# Patient Record
Sex: Male | Born: 1960 | Race: Black or African American | Hispanic: No | Marital: Married | Smoking: Never smoker
Health system: Southern US, Community
[De-identification: ages and names within clinical notes are randomized; demographics above are authoritative.]

## PROBLEM LIST (undated history)

## (undated) DIAGNOSIS — E7800 Pure hypercholesterolemia, unspecified: Secondary | ICD-10-CM

## (undated) DIAGNOSIS — E119 Type 2 diabetes mellitus without complications: Secondary | ICD-10-CM

## (undated) DIAGNOSIS — A048 Other specified bacterial intestinal infections: Secondary | ICD-10-CM

## (undated) DIAGNOSIS — I10 Essential (primary) hypertension: Secondary | ICD-10-CM

## (undated) HISTORY — DX: Other specified bacterial intestinal infections: A04.8

## (undated) HISTORY — DX: Type 2 diabetes mellitus without complications: E11.9

## (undated) HISTORY — DX: Pure hypercholesterolemia, unspecified: E78.00

## (undated) HISTORY — DX: Essential (primary) hypertension: I10

## (undated) HISTORY — PX: EYE SURGERY: SHX253

## (undated) NOTE — Progress Notes (Signed)
 Addended by: MARIANE OBIE PARALEE CHRISTELLA on: 12/31/2009     Modules accepted: Orders   Electronically signed by MARIANE PARALEE CHRISTELLA (R.N.) at 12/31/2009  1:53 PM EDT

## (undated) NOTE — Progress Notes (Signed)
 Formatting of this note is different from the original. Per PCP:  I am requesting a consultation (assistance in making a diagnosis or treatment advice) for cardiology clearance for [place] [facility]  Reports no limitations of cardiac nature. Has episodic - about once every couple of weeks - of palpitations. No fainting. No tobacco history. No family h/o premature CAD.  Meds: Current outpatient prescriptions ordered prior to encounter  Medication Sig Dispense Refill  ? Fluticasone 50 mcg/Actuation Nasl SpSn USE TWO SPRAYS IN EACH NOSTRIL ONCE DAILY AS DIRECTED FOR NASAL ALLERGIES  16  0  ? Lisinopril 20 mg Oral Tab TAKE ONE TABLET ORALLY DAILY FOR BLOOD PRESSURE AND PROTECT THE KIDNEY  90  4  ? metFORMIN  1,000 mg Oral Tab TAKE ONE TABLET ORALLY WITH BREAKFAST AND DINNER FOR DIABETES  180  4  ? Ezetimibe  - Simvastatin (VYTORIN) 10-80 mg Oral Tab TAKE ONE TABLET ORALLY EVERY DAY IN THE EVENING FOR CHOLESTEROL  90  4  ? Insulin  NPH Human (NOVOLIN N) 100 unit/mL SubQ Susp INJECT 35 UNITS UNDER THE SKIN BEFORE BREAKFAST AND INJECT 55 UNITS BEFORE DINNER FOR DIABETES MELLITUS  30  4  ? Ultra Fine Insulin  Syringe-Needle U-100 (BD INSULIN  ULTRA-FINE) 1 mL 30 x 1/2 Misc Syringe USE AS DIRECTED  100  50  ? Insulin  Regular Human (NOVOLIN R) 100 unit/mL Inj Soln 10 units sq now  10  0  ? Insulin  Regular Human (NOVOLIN R) 100 unit/mL Inj Soln 10 units  1  0  ? Pioglitazone (ACTOS) 15 mg Oral Tab TAKE THREE TABLETS ORALLY DAILY  270  4  ? Aspirin  81 mg Oral TBEC DR Tab TAKE 1 TABLET BY MOUTH DAILY  100  0  ? Omega-3 Fatty Acids-Vitamin E (OMEGA-3 FISH OIL) 1,000-5 mg-unit Oral Cap 1 CAPSULE TWICE DAILY  100  0  ? ATENOLOL  25 MG ORAL TAB TAKE ONE TABLET BY MOUTH DAILY.  60  5  ? ONE TOUCH ULTRA SYSTEM KIT use as directed  1  0   PE: BP 114/77  Pulse 95  Ht 5' 11 (1.803 m)  Wt 213 lb (96.616 kg)  SpO2 99% No JVD Lungs clear Cor reg S1S2, no MGR Abd soft Ext no edema  EKG:  SR, LVH  A/P:  34  yom with DM2 and hyperlipidemia requires cardiac evaluation for [place] [facility].  Patient has no cardiac history and has no current symptoms or limitations to suggest structural heart disease, however does have episodic palpitations.  Needs updated echocardiogram and event monitor. Electronically signed by Jolanda Terry SQUIBB (M.D.) at 12/31/2009 10:52 AM EDT

---

## 1994-06-13 HISTORY — PX: VASECTOMY: SHX75

## 2016-06-13 DIAGNOSIS — I639 Cerebral infarction, unspecified: Secondary | ICD-10-CM

## 2016-06-13 HISTORY — DX: Cerebral infarction, unspecified: I63.9

## 2018-11-27 ENCOUNTER — Encounter: Payer: Self-pay | Admitting: Gastroenterology

## 2018-11-27 ENCOUNTER — Other Ambulatory Visit: Payer: Self-pay

## 2018-11-27 ENCOUNTER — Ambulatory Visit (INDEPENDENT_AMBULATORY_CARE_PROVIDER_SITE_OTHER): Payer: Self-pay | Admitting: Gastroenterology

## 2018-11-27 ENCOUNTER — Telehealth: Payer: Self-pay | Admitting: Gastroenterology

## 2018-11-27 DIAGNOSIS — K219 Gastro-esophageal reflux disease without esophagitis: Secondary | ICD-10-CM

## 2018-11-27 DIAGNOSIS — A048 Other specified bacterial intestinal infections: Secondary | ICD-10-CM

## 2018-11-27 DIAGNOSIS — Z7689 Persons encountering health services in other specified circumstances: Secondary | ICD-10-CM

## 2018-11-27 MED ORDER — PANTOPRAZOLE SODIUM 40 MG PO TBEC: 40.0000 mg | DELAYED_RELEASE_TABLET | Freq: Every day | ORAL | 3 refills | Status: DC

## 2018-11-27 NOTE — Progress Notes (Signed)
TELEHEALTH VISIT  Referring Provider: No ref. provider found Primary Care Physician:  No primary care provider on file.   Tele-visit due to COVID-19 pandemic Patient requested visit virtually, consented to the virtual encounter via video enabled telemedicine application (patient unable to connect to zoom, converted to an audio encounter) Contact made at: 13:30 11/27/18 Patient verified by name and date of birth Location of patient: Home Location provider: [facility] Names of persons participating: Me, patient Time spent on telehealth visit: 32 minutes I discussed the limitations of evaluation and management by telemedicine. The patient expressed understanding and agreed to proceed.  Reason for Consultation:  "stomach messed up"   IMPRESSION:  Reflux and nausea H pylori diagnosed by breath test in [place], [place] History of colon polyps on colonoscopy in 2018    - patient remembers surveillance colonoscopy recommended in 5 years  Recurrent symptoms that were temporarily improved while on therapy for H pylori. EGD recommended to follow-up on the diagnosis of H pylori.   PLAN: Pantoprazole 40 mg daily Avoid all NSAIDs EGD with gastric biopsies Obtain records from [facility] - Dr. Gus Rankin Obtain records from his colonoscopy in [place], [place] from 2018 (he is unable to remember the physician's name) Referral to [facility] to establish with a new physician for health maintenance  I consented the patient discussing the risks, benefits, and alternatives to endoscopic evaluation. In particular, we discussed the risks that include, but are not limited to, reaction to medication, cardiopulmonary compromise, bleeding requiring blood transfusion, aspiration resulting in pneumonia, perforation requiring surgery, lack of diagnosis, severe illness requiring hospitalization, and even death. We reviewed the risk of missed lesion including polyps or even cancer. The patient  acknowledges these risks and asks that we proceed.   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 The history is obtained through the patient. No referral records are avilable.  Recently moved to [place] although he still works 8 days every month in [place]. He will retire in 2022.  His other physicians are located in [place], [place].  He has diabetes, hypertension, hypercholesterolemia, CVA 2018. He has had a nuclear stress test and an echo stress test 2 weeks ago per his routine testing.   His current medications include: Metformin 1000 mg BID, Novolog 40/60 BID, losarten 542 mg daily, Trulicity 7.062 weekly on Sundays Allergies: NKDA  He reports a 3 months history of intermittent "stomach being messed up." Reflux, heartburn, and "stomach won't settle." His primary care provider diagnosed him with H pylori. Treated with "four antibiotics" x 14 days. He was told he was supposed to have a follow-up. Symptoms improved on treatment but are now returning. They are present daily now. Receives one hour of relief with Tums. No dysphagia or odynophagia. No nausea. He doesn't feel like food passes beyond his stomach. Rare abdominal pain below the navel. Exacerbated by tomato based and greasy foods. Good appetite. Unintentional weight gain. No changes in his bowel habits. Has intermittent constipation that he treats with a stool softener.  No blood or mucous in the stool.  No other associated symptoms. No identified exacerbating or relieving features. No known anemia.   He was supposed to see a gastroenterologist in [place] after the diagnosis of H pylori was made but he never did. Previously on Nexium but his insurance won't pay for it.   He had colon polyps on a colonoscopy in 2018. Five year surveillance colonoscopy recommended. No prior upper  endoscopy.   No known family history of colon cancer or polyps. No family history of uterine/endometrial cancer, pancreatic  cancer or gastric/stomach cancer.      No current outpatient medications on file.   No current facility-administered medications for this visit.     Allergies as of 11/27/2018  . (No Known Allergies)    History reviewed. No pertinent family history.  Social History   Socioeconomic History  . Marital status: Married    Spouse name: Not on file  . Number of children: Not on file  . Years of education: Not on file  . Highest education level: Not on file  Occupational History  . Not on file  Social Needs  . Financial resource strain: Not on file  . Food insecurity    Worry: Not on file    Inability: Not on file  . Transportation needs    Medical: Not on file    Non-medical: Not on file  Tobacco Use  . Smoking status: Not on file  Substance and Sexual Activity  . Alcohol use: Not on file  . Drug use: Not on file  . Sexual activity: Not on file  Lifestyle  . Physical activity    Days per week: Not on file    Minutes per session: Not on file  . Stress: Not on file  Relationships  . Social Herbalist on phone: Not on file    Gets together: Not on file    Attends religious service: Not on file    Active member of club or organization: Not on file    Attends meetings of clubs or organizations: Not on file    Relationship status: Not on file  . Intimate partner violence    Fear of current or ex partner: Not on file    Emotionally abused: Not on file    Physically abused: Not on file    Forced sexual activity: Not on file  Other Topics Concern  . Not on file  Social History Narrative  . Not on file    Review of Systems: ALL ROS discussed and all others negative except listed in HPI.  Physical Exam: Complete physical exam not performed due to the limits inherent in a telehealth encounter.  General: Awake, alert, and oriented, and well communicative. In no acute distress.  HEENT: EOMI, non-icteric sclera, NCAT, MMM  Neck: Normal movement of head and  neck  Pulm: No labored breathing, speaking in full sentences without conversational dyspnea  Derm: No apparent lesions or bruising in visible field  MS: Moves all visible extremities without noticeable abnormality  Psych: Pleasant, cooperative, normal speech, normal affect and normal insight Neuro: Alert and appropriate   November Sypher L. Tarri Glenn, MD, MPH [facility] 11/27/2018, 2:08 PM

## 2018-11-27 NOTE — Patient Instructions (Addendum)
Start pantoprazole 40 mg daily.  Please avoid all NSAIDs.  I have recommended an upper endoscopy (EGD) for further evaluation.  We will work to obtain all of your records from [facility] and from your prior colonoscopy.

## 2018-11-27 NOTE — Telephone Encounter (Signed)
Noted. Presciption sent to pharmacy and patient mailed instructions for his procedure on 12-12-2018.

## 2018-11-27 NOTE — Telephone Encounter (Signed)
Patient called in and schedule his EGD 12/12/2018 @4pm  he also wanted to make sure that the medication that was to be ordered by the doctor get sent to [facility] [address], [place] [address] [phone]

## 2018-11-28 ENCOUNTER — Encounter: Payer: Self-pay | Admitting: Gastroenterology

## 2018-12-11 ENCOUNTER — Telehealth: Payer: Self-pay | Admitting: Gastroenterology

## 2018-12-11 NOTE — Telephone Encounter (Signed)
Called patient, no answer and no voicemail setup Covid-19 Screening Questions:  Do you now or have you had a fever in the last 14 days?   Do you have any respiratory symptoms of shortness of breath or cough now or in the last 14 days?   Do you have any family members or close contacts with diagnosed or suspected Covid-19 in the past 14 days?   Have you been tested for Covid-19 and found to be positive?

## 2018-12-12 ENCOUNTER — Encounter: Payer: Self-pay | Admitting: Gastroenterology

## 2018-12-12 ENCOUNTER — Other Ambulatory Visit: Payer: Self-pay

## 2018-12-12 ENCOUNTER — Ambulatory Visit: Payer: 59 | Admitting: Gastroenterology

## 2018-12-12 VITALS — BP 134/77 | HR 83 | Temp 98.3°F | Ht 71.0 in | Wt 225.0 lb

## 2018-12-12 MED ORDER — SODIUM CHLORIDE 0.9 % IV SOLN: 500.0000 mL | Freq: Once | INTRAVENOUS | Status: DC

## 2018-12-12 NOTE — Progress Notes (Signed)
Upon admission Patient disclosed that he had eaten spaghetti with meat at 10 am and drank water at 2:00. Per MD and CRNA, patient would not be able to complete procedure until 6:00 so he was rescheduled for 12/21/2018. He agreed and understood.

## 2018-12-12 NOTE — Progress Notes (Signed)
Temps taken by CW V/S taken by JB

## 2018-12-20 ENCOUNTER — Telehealth: Payer: Self-pay | Admitting: Gastroenterology

## 2018-12-20 NOTE — Telephone Encounter (Signed)

## 2018-12-21 ENCOUNTER — Encounter: Payer: Self-pay | Admitting: Gastroenterology

## 2018-12-21 ENCOUNTER — Telehealth: Payer: Self-pay | Admitting: Gastroenterology

## 2018-12-21 ENCOUNTER — Other Ambulatory Visit: Payer: Self-pay

## 2018-12-21 ENCOUNTER — Ambulatory Visit (AMBULATORY_SURGERY_CENTER): Payer: 59 | Admitting: Gastroenterology

## 2018-12-21 VITALS — BP 140/87 | HR 97 | Temp 98.3°F | Resp 20 | Ht 72.0 in | Wt 216.0 lb

## 2018-12-21 DIAGNOSIS — K3189 Other diseases of stomach and duodenum: Secondary | ICD-10-CM

## 2018-12-21 DIAGNOSIS — K219 Gastro-esophageal reflux disease without esophagitis: Secondary | ICD-10-CM

## 2018-12-21 DIAGNOSIS — K2970 Gastritis, unspecified, without bleeding: Secondary | ICD-10-CM

## 2018-12-21 DIAGNOSIS — A048 Other specified bacterial intestinal infections: Secondary | ICD-10-CM

## 2018-12-21 MED ORDER — SODIUM CHLORIDE 0.9 % IV SOLN: 500.0000 mL | Freq: Once | INTRAVENOUS | Status: DC

## 2018-12-21 NOTE — Progress Notes (Signed)
Temp taken by Izora Gala, LPN, VS taken by [facility], [place]

## 2018-12-21 NOTE — Patient Instructions (Signed)
Gastritis noted today.  Please read handout. Continue present meds including pantoprazole 40mg  everyday. Await pathology results from biopsies today.  Follow-up encounter to review the pathology results and make additional recommendations.  Dr Tarri Glenn' nurse will call you to schedule this appointment.  The office number is [phone]. AVOID ALL NSAIDS!  NO IBUPROFEN, ALEVE,NAPROXEN OR ASPIRIN!      YOU HAD AN ENDOSCOPIC PROCEDURE TODAY AT [facility]:   Refer to the procedure report that was given to you for any specific questions about what was found during the examination.  If the procedure report does not answer your questions, please call your gastroenterologist to clarify.  If you requested that your care partner not be given the details of your procedure findings, then the procedure report has been included in a sealed envelope for you to review at your convenience later.  YOU SHOULD EXPECT: Some feelings of bloating in the abdomen. Passage of more gas than usual.  Walking can help get rid of the air that was put into your GI tract during the procedure and reduce the bloating. If you had a lower endoscopy (such as a colonoscopy or flexible sigmoidoscopy) you may notice spotting of blood in your stool or on the toilet paper. If you underwent a bowel prep for your procedure, you may not have a normal bowel movement for a few days.  Please Note:  You might notice some irritation and congestion in your nose or some drainage.  This is from the oxygen used during your procedure.  There is no need for concern and it should clear up in a day or so.  SYMPTOMS TO REPORT IMMEDIATELY:      Following upper endoscopy (EGD)  Vomiting of blood or coffee ground material  New chest pain or pain under the shoulder blades  Painful or persistently difficult swallowing  New shortness of breath  Fever of 100F or higher  Black, tarry-looking stools  For urgent or emergent issues, a  gastroenterologist can be reached at any hour by calling [phone].   DIET:  We do recommend a small meal at first, but then you may proceed to your regular diet.  Drink plenty of fluids but you should avoid alcoholic beverages for 24 hours.  ACTIVITY:  You should plan to take it easy for the rest of today and you should NOT DRIVE or use heavy machinery until tomorrow (because of the sedation medicines used during the test).    FOLLOW UP: Our staff will call the number listed on your records 48-72 hours following your procedure to check on you and address any questions or concerns that you may have regarding the information given to you following your procedure. If we do not reach you, we will leave a message.  We will attempt to reach you two times.  During this call, we will ask if you have developed any symptoms of COVID 19. If you develop any symptoms (ie: fever, flu-like symptoms, shortness of breath, cough etc.) before then, please call [phone].  If you test positive for Covid 19 in the 2 weeks post procedure, please call and report this information to [place].    If any biopsies were taken you will be contacted by phone or by letter within the next 1-3 weeks.  Please call us at [phone] if you have not heard about the biopsies in 3 weeks.    SIGNATURES/CONFIDENTIALITY: You and/or your care partner have signed paperwork which will be entered into  your electronic medical record.  These signatures attest to the fact that that the information above on your After Visit Summary has been reviewed and is understood.  Full responsibility of the confidentiality of this discharge information lies with you and/or your care-partner.

## 2018-12-21 NOTE — Telephone Encounter (Signed)
Pt had EGD at 9 and stated that he had left his phone in his bag.  Please return call [phone]

## 2018-12-21 NOTE — Op Note (Signed)
[facility] Patient Name: [name] [name] Procedure Date: 12/21/2018 9:06 AM MRN: [id number] Endoscopist: Thornton Park MD, MD Age: 58 Referring MD:  Date of Birth: Sep 01, 1960 Gender: Male Account #: [id number] Procedure:                Upper GI endoscopy Indications:              Follow-up of Helicobacter pylori, Nausea with                            vomiting                           Reflux and nausea                           H pylori diagnosed by breath test in [place], [place]                            and treated with Prevpac Medicines:                See the Anesthesia note for documentation of the                            administered medications Procedure:                Pre-Anesthesia Assessment:                           - Prior to the procedure, a History and Physical                            was performed, and patient medications and                            allergies were reviewed. The patient's tolerance of                            previous anesthesia was also reviewed. The risks                            and benefits of the procedure and the sedation                            options and risks were discussed with the patient.                            All questions were answered, and informed consent                            was obtained. Prior Anticoagulants: The patient has                            taken no previous anticoagulant or antiplatelet  agents. ASA Grade Assessment: II - A patient with                            mild systemic disease. After reviewing the risks                            and benefits, the patient was deemed in                            satisfactory condition to undergo the procedure.                           After obtaining informed consent, the endoscope was                            passed under direct vision. Throughout the                            procedure, the patient's blood  pressure, pulse, and                            oxygen saturations were monitored continuously. The                            Endoscope was introduced through the mouth, and                            advanced to the third part of duodenum. The upper                            GI endoscopy was accomplished without difficulty.                            The patient tolerated the procedure well. Scope In: Scope Out: Findings:                 The Z-line was irregular. A small island was seen                            adjacent to the z-line. Biopsies were taken with a                            cold forceps for histology. Estimated blood loss                            was minimal.                           Localized moderate inflammation was found in the                            gastric fundus and on the greater curvature of the  gastric body. Biopsies were taken with a cold                            forceps for histology from the antrum, body, and                            fundus. Estimated blood loss was minimal.                           The examined duodenum was normal. Biopsies were                            taken with a cold forceps for histology. Estimated                            blood loss was minimal.                           The cardia and gastric fundus were normal on                            retroflexion.                           The exam was otherwise without abnormality. Complications:            No immediate complications. Estimated blood loss:                            Minimal. Estimated Blood Loss:     Estimated blood loss was minimal. Impression:               - Z-line irregular. Biopsied.                           - Gastritis. Biopsied.                           - Normal examined duodenum. Biopsied.                           - The examination was otherwise normal. Recommendation:           - Patient has a contact number available  for                            emergencies. The signs and symptoms of potential                            delayed complications were discussed with the                            patient. Return to normal activities tomorrow.                            Written discharge instructions were provided to the  patient.                           - Resume regular diet today.                           - Continue present medications including                            pantoprazole 40 mg daily.                           - Avoid all NSAIDs.                           - Await pathology results.                           - Follow-up encounter to review the pathology                            results and make additional recommendations. Thornton Park MD, MD 12/21/2018 9:29:28 AM This report has been signed electronically.

## 2018-12-21 NOTE — Progress Notes (Signed)
Pt. BG 57 upon admission, Pt. Denies SXS of hypoglycemia, Dr. Tarri Glenn notified, up D5W at [facility], BG recheck 94 prior to being taken to procedure room.

## 2018-12-21 NOTE — Progress Notes (Signed)
Called to room to assist during endoscopic procedure.  Patient ID and intended procedure confirmed with present staff. Received instructions for my participation in the procedure from the performing physician.  

## 2018-12-21 NOTE — Telephone Encounter (Signed)
After receiving word that Mr Debroux felt his cellphone was left in his belonging bag, bag located in trash.  Noted the name Barney on outside of empty bag.  Searched the rest of trashcan.  [place] looked through Regulatory affairs officer and did not find phone.  Jani Gravel RN in admitting dept looked beneath stretchers and did not find phone.  Called pt's mobile number and pt answered.  When questioned did you find your phone, pt answered "yes".  Supervisor notified.

## 2018-12-21 NOTE — Progress Notes (Signed)
PT taken to PACU. Monitors in place. VSS. Report given to RN. 

## 2018-12-24 ENCOUNTER — Telehealth: Payer: Self-pay | Admitting: *Deleted

## 2018-12-24 NOTE — Telephone Encounter (Signed)
Attempted to call the patient to schedule a follow up visit with Dr. Tarri Glenn to review pathology results and make additional recommendations.   The patient did not have a VM that was currently set up, this RN was unable to leave a message.

## 2018-12-25 ENCOUNTER — Telehealth: Payer: Self-pay | Admitting: *Deleted

## 2018-12-25 NOTE — Telephone Encounter (Signed)
  Follow up Call-  Call back number 12/21/2018 12/12/2018  Post procedure Call Back phone  # [phone]  Permission to leave phone message Yes Yes  Some recent data might be hidden     Patient questions:  Do you have a fever, pain , or abdominal swelling? No. Pain Score  0 *  Have you tolerated food without any problems? Yes.    Have you been able to return to your normal activities? Yes.    Do you have any questions about your discharge instructions: Diet   No. Medications  No. Follow up visit  No.  Do you have questions or concerns about your Care? No.  Actions: * If pain score is 4 or above: No action needed, pain <4. 1. Have you developed a fever since your procedure? no  2.   Have you had an respiratory symptoms (SOB or cough) since your procedure? no  3.   Have you tested positive for COVID 19 since your procedure no  4.   Have you had any family members/close contacts diagnosed with the COVID 19 since your procedure?  no   If yes to any of these questions please route to Joylene John, RN and Alphonsa Gin, Therapist, sports.

## 2018-12-25 NOTE — Telephone Encounter (Signed)
First follow up call attempt.  Reached a full mailbox, unable to leave a message.

## 2019-01-02 ENCOUNTER — Encounter: Payer: Self-pay | Admitting: *Deleted

## 2019-01-14 ENCOUNTER — Ambulatory Visit: Payer: 59 | Admitting: Gastroenterology

## 2019-01-31 ENCOUNTER — Ambulatory Visit: Payer: 59 | Admitting: Gastroenterology

## 2019-07-24 ENCOUNTER — Emergency Department (HOSPITAL_COMMUNITY)
Admission: EM | Admit: 2019-07-24 | Discharge: 2019-07-24 | Disposition: A | Discharge status: Home / Self Care | Payer: 59 | Attending: Emergency Medicine | Admitting: Emergency Medicine

## 2019-07-24 ENCOUNTER — Emergency Department (HOSPITAL_COMMUNITY): Payer: 59

## 2019-07-24 ENCOUNTER — Other Ambulatory Visit: Payer: Self-pay

## 2019-07-24 DIAGNOSIS — Z79899 Other long term (current) drug therapy: Secondary | ICD-10-CM | POA: Diagnosis not present

## 2019-07-24 DIAGNOSIS — I10 Essential (primary) hypertension: Secondary | ICD-10-CM | POA: Insufficient documentation

## 2019-07-24 DIAGNOSIS — Z794 Long term (current) use of insulin: Secondary | ICD-10-CM | POA: Diagnosis not present

## 2019-07-24 DIAGNOSIS — K5900 Constipation, unspecified: Secondary | ICD-10-CM | POA: Insufficient documentation

## 2019-07-24 DIAGNOSIS — E119 Type 2 diabetes mellitus without complications: Secondary | ICD-10-CM | POA: Diagnosis not present

## 2019-07-24 LAB — CBC WITH DIFFERENTIAL/PLATELET
Abs Immature Granulocytes: 0.02 10*3/uL (ref 0.00–0.07)
Basophils Absolute: 0 10*3/uL (ref 0.0–0.1)
Basophils Relative: 1 %
Eosinophils Absolute: 0.2 10*3/uL (ref 0.0–0.5)
Eosinophils Relative: 4 %
HCT: 39.8 % (ref 39.0–52.0)
Hemoglobin: 13.1 g/dL (ref 13.0–17.0)
Immature Granulocytes: 0 %
Lymphocytes Relative: 36 %
Lymphs Abs: 2.1 10*3/uL (ref 0.7–4.0)
MCH: 29.2 pg (ref 26.0–34.0)
MCHC: 32.9 g/dL (ref 30.0–36.0)
MCV: 88.6 fL (ref 80.0–100.0)
Monocytes Absolute: 0.4 10*3/uL (ref 0.1–1.0)
Monocytes Relative: 8 %
Neutro Abs: 3 10*3/uL (ref 1.7–7.7)
Neutrophils Relative %: 51 %
Platelets: 316 10*3/uL (ref 150–400)
RBC: 4.49 MIL/uL (ref 4.22–5.81)
RDW: 12.8 % (ref 11.5–15.5)
WBC: 5.8 10*3/uL (ref 4.0–10.5)
nRBC: 0 % (ref 0.0–0.2)

## 2019-07-24 LAB — COMPREHENSIVE METABOLIC PANEL
ALT: 26 U/L (ref 0–44)
AST: 17 U/L (ref 15–41)
Albumin: 3.9 g/dL (ref 3.5–5.0)
Alkaline Phosphatase: 63 U/L (ref 38–126)
Anion gap: 9 (ref 5–15)
BUN: 14 mg/dL (ref 6–20)
CO2: 27 mmol/L (ref 22–32)
Calcium: 9.7 mg/dL (ref 8.9–10.3)
Chloride: 99 mmol/L (ref 98–111)
Creatinine, Ser: 1.42 mg/dL — ABNORMAL HIGH (ref 0.61–1.24)
GFR calc Af Amer: >60 mL/min (ref >60)
GFR calc non Af Amer: 55 mL/min — ABNORMAL LOW (ref >60)
Glucose, Bld: 134 mg/dL — ABNORMAL HIGH (ref 70–99)
Potassium: 3.9 mmol/L (ref 3.5–5.1)
Sodium: 135 mmol/L (ref 135–145)
Total Bilirubin: 1 mg/dL (ref 0.3–1.2)
Total Protein: 7.3 g/dL (ref 6.5–8.1)

## 2019-07-24 LAB — URINALYSIS, ROUTINE W REFLEX MICROSCOPIC
Bilirubin Urine: NEGATIVE
Glucose, UA: NEGATIVE mg/dL
Hgb urine dipstick: NEGATIVE
Ketones, ur: NEGATIVE mg/dL
Leukocytes,Ua: NEGATIVE
Nitrite: NEGATIVE
Protein, ur: NEGATIVE mg/dL
Specific Gravity, Urine: 1.014 (ref 1.005–1.030)
pH: 6 (ref 5.0–8.0)

## 2019-07-24 LAB — TROPONIN I (HIGH SENSITIVITY)
Troponin I (High Sensitivity): 3 ng/L (ref <18)
Troponin I (High Sensitivity): 3 ng/L (ref <18)

## 2019-07-24 LAB — LIPASE, BLOOD: Lipase: 45 U/L (ref 11–51)

## 2019-07-24 NOTE — ED Provider Notes (Signed)
  Provider Note MRN:  [id number]  Arrival date & time: 07/24/19    ED Course and Medical Decision Making  Assumed care from Dr. Dina Rich at shift change.  History of hemorrhagic stroke here with largely asymptomatic hypertension and constipation.  Work-up thus far reassuring.  EKG demonstrated some likely benign early repolarization, however no prior EKG on record.  Will obtain second troponin to ensure no cardiac issues today.  Anticipating discharge.  Second troponin negative, appropriate for discharge.  Procedures  Final Clinical Impressions(s) / ED Diagnoses     ICD-10-CM   1. Essential hypertension  I10   2. Constipation, unspecified constipation type  K59.00     ED Discharge Orders    None      Discharge Instructions   None     Barth Kirks. Sedonia Small, [place] [email or web address]    Maudie Flakes, MD 07/24/19 1242

## 2019-07-24 NOTE — ED Triage Notes (Signed)
Pt from home, states bp was 170/103 and that it has been sustaining in this range for 24 hours. Pt doubled bp meds (metoprolol 100mg  and losartan 100mg ) and said bp "went up a couple of numbers. Also complains of no bowel movement "for a week."

## 2019-07-24 NOTE — Discharge Instructions (Addendum)
You were evaluated in the Emergency Department and after careful evaluation, we did not find any emergent condition requiring admission or further testing in the hospital.  Your exam/testing today is overall reassuring.  Please return to the Emergency Department if you experience any worsening of your condition.  We encourage you to follow up with a primary care provider.  Thank you for allowing us to be a part of your care. 

## 2019-07-24 NOTE — ED Provider Notes (Signed)
[facility] EMERGENCY DEPARTMENT Provider Note   CSN: [id number] Arrival date & time: 07/24/19  [id number]     History Chief Complaint  Patient presents with  . Hypertension    Marq Warmkessel is a 59 y.o. male.  HPI     This a 59 year old male with history of CVA, diabetes, H. pylori, hypertension who presents with concerns for high blood pressure.  Patient reports that he monitors his blood pressure at home closely because of his CVA in 2019.  He reports that his blood pressures normally 140s over 80s.  Over the last 2 to 3 days he has noted increasing blood pressures to systolics of [id number] over 0000000.  Denies any chest pain, shortness of breath, fevers, cough.  He doubled his medication yesterday with persistent hypertension.  He denies headache.  He does report some lower abdominal pressure.  He reports that he has not had a bowel movement in over 1 week.  He has not tried anything to have a bowel movement.  Denies any constipated medications.  No nausea or vomiting.  Past Medical History:  Diagnosis Date  . CVA (cerebral vascular accident) ([place]) 2018  . Diabetes ([place])   . H. pylori infection   . Hypercholesteremia   . Hypertension     There are no problems to display for this patient.   Past Surgical History:  Procedure Laterality Date  . VASECTOMY  1996       Family History  Problem Relation Age of Onset  . Colon cancer Neg Hx   . Stomach cancer Neg Hx   . Rectal cancer Neg Hx   . Esophageal cancer Neg Hx     Social History   Tobacco Use  . Smoking status: Never Smoker  . Smokeless tobacco: Never Used  Substance Use Topics  . Alcohol use: Not Currently    Comment: stopped drinking alcohol in 2009  . Drug use: Never    Home Medications Prior to Admission medications   Medication Sig Start Date End Date Taking? Authorizing Provider  amLODipine (NORVASC) 10 MG tablet Take 10 mg by mouth daily.    [provider]  Dulaglutide (TRULICITY) 1.5 0000000 SOPN  Inject into the skin once a week.    [provider]  hydrochlorothiazide (HYDRODIURIL) 25 MG tablet Take 25 mg by mouth daily.    [provider]  insulin aspart protamine- aspart (NOVOLOG MIX 70/30) (70-30) 100 UNIT/ML injection Inject into the skin 2 (two) times daily with a meal.    [provider]  losartan (COZAAR) 100 MG tablet Take 100 mg by mouth daily.    [provider]  metFORMIN (GLUCOPHAGE) 1000 MG tablet Take 1,000 mg by mouth 2 (two) times daily with a meal.    [provider]  metoprolol tartrate (LOPRESSOR) 100 MG tablet Take 100 mg by mouth 2 (two) times daily.    [provider]  pantoprazole (PROTONIX) 40 MG tablet Take 1 tablet (40 mg total) by mouth daily. 11/27/18   Thornton Park, MD  rosuvastatin (CRESTOR) 40 MG tablet Take 40 mg by mouth daily.    [provider]  sildenafil (VIAGRA) 100 MG tablet Take 100 mg by mouth daily as needed for erectile dysfunction.    [provider]  sitaGLIPtin (JANUVIA) 100 MG tablet Take 100 mg by mouth daily.    [provider]    Allergies    Patient has no known allergies.  Review of Systems   Review of Systems  Constitutional: Negative for fever.  Respiratory: Negative for shortness of breath.   Cardiovascular: Negative for chest pain.  Gastrointestinal: Positive for abdominal pain and constipation. Negative for diarrhea, nausea and vomiting.  Genitourinary: Negative for dysuria.  Neurological: Negative for headaches.  All other systems reviewed and are negative.   Physical Exam Updated Vital Signs BP (!) 155/92   Pulse 80   Temp 98.4 F (36.9 C)   Resp 11   Ht 1.803 m (5\' 11" )   Wt 96.2 kg   SpO2 100%   BMI 29.57 kg/m   Physical Exam Vitals and nursing note reviewed.  Constitutional:      Appearance: He is well-developed. He is not ill-appearing.  HENT:     Head: Normocephalic and atraumatic.     Mouth/Throat:     Mouth:  Mucous membranes are moist.  Eyes:     Pupils: Pupils are equal, round, and reactive to light.  Cardiovascular:     Rate and Rhythm: Normal rate and regular rhythm.     Heart sounds: Normal heart sounds. No murmur.  Pulmonary:     Effort: Pulmonary effort is normal. No respiratory distress.     Breath sounds: Normal breath sounds. No wheezing.  Abdominal:     General: Bowel sounds are normal.     Palpations: Abdomen is soft.     Tenderness: There is no abdominal tenderness. There is no guarding or rebound.  Musculoskeletal:     Cervical back: Neck supple.     Right lower leg: No edema.     Left lower leg: No edema.  Lymphadenopathy:     Cervical: No cervical adenopathy.  Skin:    General: Skin is warm and dry.  Neurological:     General: No focal deficit present.     Mental Status: He is alert and oriented to person, place, and time.  Psychiatric:        Mood and Affect: Mood normal.     ED Results / Procedures / Treatments   Labs (all labs ordered are listed, but only abnormal results are displayed) Labs Reviewed  COMPREHENSIVE METABOLIC PANEL - Abnormal; Notable for the following components:      Result Value   Glucose, Bld 134 (*)    Creatinine, Ser 1.42 (*)    GFR calc non Af Amer 55 (*)    All other components within normal limits  CBC WITH DIFFERENTIAL/PLATELET  LIPASE, BLOOD  URINALYSIS, ROUTINE W REFLEX MICROSCOPIC  TROPONIN I (HIGH SENSITIVITY)  TROPONIN I (HIGH SENSITIVITY)    EKG EKG Interpretation  Date/Time:  Wednesday July 24 2019 05:55:22 EST Ventricular Rate:  77 PR Interval:    QRS Duration: 93 QT Interval:  368 QTC Calculation: 417 R Axis:   30 Text Interpretation: Sinus rhythm Abnormal R-wave progression, early transition Repol abnrm suggests ischemia, inferior leads No prior for comparison Confirmed by Thayer Jew [phone]) on 07/24/2019 6:06:39 AM   Radiology DG Abdomen Acute W/Chest  Result Date: 07/24/2019 CLINICAL DATA:   Constipation, hypotension EXAM: DG ABDOMEN ACUTE W/ 1V CHEST COMPARISON:  None. FINDINGS: No consolidation, features of edema, pneumothorax, or effusion. Pulmonary vascularity is normally distributed. The cardiomediastinal contours are unremarkable. No free subdiaphragmatic air. No high-grade obstructive bowel gas pattern. No suspicious calcifications. No acute osseous or soft tissue abnormality. Degenerative changes present in the spine, shoulders and hips. Telemetry leads overlie the chest. IMPRESSION: Negative abdominal radiographs.  No acute cardiopulmonary disease. Electronically Signed   By: Elwin Sleight.D.  On: 07/24/2019 06:50    Procedures Procedures (including critical care time)  Medications Ordered in ED Medications - No data to display  ED Course  I have reviewed the triage vital signs and the nursing notes.  Pertinent labs & imaging results that were available during my care of the patient were reviewed by me and considered in my medical decision making (see chart for details).    MDM Rules/Calculators/A&P                       Patient presents with concerns for high blood pressure.  He is overall nontoxic-appearing.  He is relatively asymptomatic.  No chest pain or shortness of breath.  He does report some constipation over the last week.  His exam is reassuring.  EKG shows ST elevations which are likely repolarization changes; however, no prior for comparison.  Will obtain a troponin given his blood pressure.  Additionally basic lab obtained.  No significant metabolic derangements.  Acute abdominal series with no significant stool burden and no evidence of pneumothorax or pneumonia.  Urinalysis and repeat troponin pending.  At this time I do not have any suspicion for hypertensive urgency or emergency.  If follow-up labs are reassuring, he can be discharged home with follow-up with his primary physician for medication adjustment.  Also can start a laxative.  Patient signed out  to Dr. Sedonia Small.  Final Clinical Impression(s) / ED Diagnoses Final diagnoses:  Essential hypertension  Constipation, unspecified constipation type    Rx / DC Orders ED Discharge Orders    None       Merryl Hacker, MD 07/24/19 [phone]

## 2019-11-25 ENCOUNTER — Other Ambulatory Visit: Payer: Self-pay | Admitting: Gastroenterology

## 2019-12-18 ENCOUNTER — Other Ambulatory Visit: Payer: Self-pay | Admitting: Gastroenterology

## 2020-01-01 ENCOUNTER — Other Ambulatory Visit: Payer: Self-pay | Admitting: Gastroenterology

## 2020-01-01 NOTE — Telephone Encounter (Signed)
May have refill until time of follow-up. Thank you.

## 2020-11-02 LAB — BASIC METABOLIC PANEL
BUN: 20 (ref 4–21)
Creatinine: 1.9 — AB (ref <=1.3)

## 2020-11-02 LAB — LIPID PANEL
Cholesterol: 268 — AB (ref 0–200)
HDL: 47 (ref 35–70)
LDL Cholesterol: 170
Triglycerides: 288 — AB (ref 40–160)

## 2020-11-02 LAB — HEMOGLOBIN A1C: Hemoglobin A1C: 11.3

## 2020-11-02 LAB — MICROALBUMIN, URINE: Microalb, Ur: 16.9

## 2021-01-16 ENCOUNTER — Encounter (HOSPITAL_COMMUNITY): Payer: Self-pay

## 2021-01-16 ENCOUNTER — Emergency Department (HOSPITAL_COMMUNITY)
Admission: EM | Admit: 2021-01-16 | Discharge: 2021-01-16 | Disposition: A | Discharge status: Home / Self Care | Payer: 59 | Attending: Emergency Medicine | Admitting: Emergency Medicine

## 2021-01-16 ENCOUNTER — Other Ambulatory Visit: Payer: Self-pay

## 2021-01-16 DIAGNOSIS — X102XXA Contact with fats and cooking oils, initial encounter: Secondary | ICD-10-CM | POA: Insufficient documentation

## 2021-01-16 DIAGNOSIS — E7800 Pure hypercholesterolemia, unspecified: Secondary | ICD-10-CM | POA: Insufficient documentation

## 2021-01-16 DIAGNOSIS — E1169 Type 2 diabetes mellitus with other specified complication: Secondary | ICD-10-CM | POA: Insufficient documentation

## 2021-01-16 DIAGNOSIS — T22011A Burn of unspecified degree of right forearm, initial encounter: Secondary | ICD-10-CM | POA: Diagnosis present

## 2021-01-16 DIAGNOSIS — Z79899 Other long term (current) drug therapy: Secondary | ICD-10-CM | POA: Diagnosis not present

## 2021-01-16 DIAGNOSIS — Z794 Long term (current) use of insulin: Secondary | ICD-10-CM | POA: Insufficient documentation

## 2021-01-16 DIAGNOSIS — I10 Essential (primary) hypertension: Secondary | ICD-10-CM | POA: Insufficient documentation

## 2021-01-16 DIAGNOSIS — T24132A Burn of first degree of left lower leg, initial encounter: Secondary | ICD-10-CM | POA: Diagnosis not present

## 2021-01-16 DIAGNOSIS — T22211A Burn of second degree of right forearm, initial encounter: Secondary | ICD-10-CM | POA: Insufficient documentation

## 2021-01-16 MED ORDER — SILVER SULFADIAZINE 1 % EX CREA
TOPICAL_CREAM | Freq: Once | CUTANEOUS | Status: AC
  Filled 2021-01-16: qty 50

## 2021-01-16 MED ORDER — SILVER SULFADIAZINE 1 % EX CREA: 1.0000 "application " | TOPICAL_CREAM | Freq: Every day | CUTANEOUS | 2 refills | Status: DC

## 2021-01-16 MED ORDER — NAPROXEN 500 MG PO TABS: 500.0000 mg | ORAL_TABLET | Freq: Two times a day (BID) | ORAL | 0 refills | Status: DC

## 2021-01-16 MED ORDER — DIPHENHYDRAMINE HCL 50 MG/ML IJ SOLN
25.0000 mg | Freq: Once | INTRAMUSCULAR | Status: AC
  Administered 2021-01-16: 25 mg via INTRAVENOUS
  Filled 2021-01-16: qty 1

## 2021-01-16 NOTE — ED Notes (Signed)
Pt with localized hives above IV site around left AC. EDP made aware.
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
## 2021-01-16 NOTE — Discharge Instructions (Addendum)
Apply Silvadene twice a day, keep the wound covered with a sterile dressing, this will likely take a couple of weeks to heal.  Please take Naprosyn, '500mg'$  by mouth twice daily as needed for pain - this in an antiinflammatory medicine (NSAID) and is similar to ibuprofen - many people feel that it is stronger than ibuprofen and it is easier to take since it is a smaller pill.  Please use this only for 1 week - if your pain persists, you will need to follow up with your doctor in the office for ongoing guidance and pain control.

## 2021-03-30 LAB — HEMOGLOBIN A1C: Hemoglobin A1C: 10.3

## 2021-05-03 ENCOUNTER — Encounter: Payer: Self-pay | Admitting: "Endocrinology

## 2021-05-03 ENCOUNTER — Other Ambulatory Visit: Payer: Self-pay

## 2021-05-03 ENCOUNTER — Ambulatory Visit: Payer: 59 | Admitting: "Endocrinology

## 2021-05-03 VITALS — BP 155/94 | HR 90 | Ht 70.0 in | Wt 210.0 lb

## 2021-05-03 DIAGNOSIS — E1159 Type 2 diabetes mellitus with other circulatory complications: Secondary | ICD-10-CM

## 2021-05-03 DIAGNOSIS — E663 Overweight: Secondary | ICD-10-CM

## 2021-05-03 DIAGNOSIS — L84 Corns and callosities: Secondary | ICD-10-CM

## 2021-05-03 DIAGNOSIS — I10 Essential (primary) hypertension: Secondary | ICD-10-CM | POA: Diagnosis not present

## 2021-05-03 DIAGNOSIS — E1122 Type 2 diabetes mellitus with diabetic chronic kidney disease: Secondary | ICD-10-CM | POA: Diagnosis not present

## 2021-05-03 DIAGNOSIS — E782 Mixed hyperlipidemia: Secondary | ICD-10-CM | POA: Insufficient documentation

## 2021-05-03 DIAGNOSIS — E1142 Type 2 diabetes mellitus with diabetic polyneuropathy: Secondary | ICD-10-CM

## 2021-05-03 DIAGNOSIS — Z6830 Body mass index (BMI) 30.0-30.9, adult: Secondary | ICD-10-CM

## 2021-05-03 DIAGNOSIS — Z7189 Other specified counseling: Secondary | ICD-10-CM

## 2021-05-03 DIAGNOSIS — N189 Chronic kidney disease, unspecified: Secondary | ICD-10-CM

## 2021-05-03 NOTE — Patient Instructions (Signed)

## 2021-05-03 NOTE — Progress Notes (Signed)
Endocrinology Consult Note       05/03/2021, 12:56 PM   Subjective:    Patient ID: [name] [name], male    DOB: 11/09/1960.  [name] [name] is being seen in consultation for management of currently uncontrolled symptomatic diabetes requested by  Abran Richard, MD.   Past Medical History:  Diagnosis Date   CVA (cerebral vascular accident) ([place]) 2018   Diabetes ([place])    Diabetes mellitus, type II ([place])    H. pylori infection    Hypercholesteremia    Hypertension     Past Surgical History:  Procedure Laterality Date   VASECTOMY  1996    Social History   Socioeconomic History   Marital status: Married    Spouse name: Not on file   Number of children: Not on file   Years of education: Not on file   Highest education level: Not on file  Occupational History   Not on file  Tobacco Use   Smoking status: Never   Smokeless tobacco: Never  Vaping Use   Vaping Use: Never used  Substance and Sexual Activity   Alcohol use: Not Currently    Comment: stopped drinking alcohol in 2009   Drug use: Never   Sexual activity: Not on file  Other Topics Concern   Not on file  Social History Narrative   Not on file   Social Determinants of Health   Financial Resource Strain: Not on file  Food Insecurity: Not on file  Transportation Needs: Not on file  Physical Activity: Not on file  Stress: Not on file  Social Connections: Not on file    Family History  Problem Relation Age of Onset   Diabetes Mother    Colon cancer Neg Hx    Stomach cancer Neg Hx    Rectal cancer Neg Hx    Esophageal cancer Neg Hx     Outpatient Encounter Medications as of 05/03/2021  Medication Sig   amLODipine (NORVASC) 10 MG tablet Take 1 tablet by mouth daily.   metFORMIN (GLUCOPHAGE) 500 MG tablet Take 500 mg by mouth 2 (two) times daily after a meal.   Dulaglutide (TRULICITY) 1.5 QV/9.5GL SOPN Inject into the skin  once a week.   hydrochlorothiazide (HYDRODIURIL) 25 MG tablet Take 25 mg by mouth every morning.   insulin aspart protamine- aspart (NOVOLOG MIX 70/30) (70-30) 100 UNIT/ML injection Inject 40 Units into the skin 2 (two) times daily before a meal.   losartan (COZAAR) 100 MG tablet Take 100 mg by mouth daily.   metoprolol tartrate (LOPRESSOR) 100 MG tablet Take 100 mg by mouth daily.   naproxen (NAPROSYN) 500 MG tablet Take 1 tablet (500 mg total) by mouth 2 (two) times daily with a meal. (Patient not taking: Reported on 05/03/2021)   pantoprazole (PROTONIX) 40 MG tablet TAKE 1 TABLET BY MOUTH EVERY DAY   rosuvastatin (CRESTOR) 40 MG tablet Take 40 mg by mouth daily.   sildenafil (VIAGRA) 100 MG tablet Take 100 mg by mouth daily as needed for erectile dysfunction.   silver sulfADIAZINE (SILVADENE) 1 % cream Apply 1 application topically daily. (Patient not taking: Reported on 05/03/2021)  sitaGLIPtin (JANUVIA) 100 MG tablet Take 100 mg by mouth daily. (Patient not taking: Reported on 05/03/2021)   tamsulosin (FLOMAX) 0.4 MG CAPS capsule Take 0.4 mg by mouth at bedtime.   [DISCONTINUED] HUMALOG KWIKPEN 100 UNIT/ML KwikPen Inject 20-40 Units into the skin 3 (three) times daily with meals.   [DISCONTINUED] metFORMIN (GLUCOPHAGE) 1000 MG tablet Take 1,000 mg by mouth 2 (two) times daily with a meal.   No facility-administered encounter medications on file as of 05/03/2021.    ALLERGIES: Allergies  Allergen Reactions   Morphine And Related Hives    VACCINATION STATUS: There is no immunization history for the selected administration types on file for this patient.  Diabetes He presents for his initial diabetic visit. He has type 2 diabetes mellitus. Onset time: Patient was diagnosed at approximate age of 64 years. His disease course has been worsening. There are no hypoglycemic associated symptoms. Pertinent negatives for hypoglycemia include no confusion, headaches, pallor or seizures.  Associated symptoms include polydipsia and polyuria. Pertinent negatives for diabetes include no chest pain, no fatigue, no polyphagia and no weakness. There are no hypoglycemic complications. Symptoms are worsening. Diabetic complications include a CVA, nephropathy and peripheral neuropathy. Risk factors for coronary artery disease include dyslipidemia, diabetes mellitus, family history, obesity, male sex, hypertension and sedentary lifestyle. Current diabetic treatment includes insulin injections (Is currently on NovoLog 70/30 60 units a.m., 40 units p.m.  He is also on Humalog 20-40 units 3 times daily AC.  He is also on metformin 1000 mg p.o. twice daily.). His weight is fluctuating minimally. He is following a generally unhealthy diet. When asked about meal planning, he reported none. He has not had a previous visit with a dietitian. He participates in exercise intermittently. His home blood glucose trend is fluctuating minimally. His overall blood glucose range is >200 mg/dl. (He did not bring any logs nor meter with him.  His recent A1c was 10.3%.  His A1c prior to that was 11+ percent.   ) An ACE inhibitor/angiotensin II receptor blocker is being taken.  Hyperlipidemia This is a chronic problem. The current episode started more than 1 year ago. The problem is uncontrolled. Exacerbating diseases include chronic renal disease, diabetes and obesity. Pertinent negatives include no chest pain, myalgias or shortness of breath. Current antihyperlipidemic treatment includes statins. Risk factors for coronary artery disease include family history, dyslipidemia, male sex, obesity, hypertension, diabetes mellitus and a sedentary lifestyle.  Hypertension This is a chronic problem. The current episode started more than 1 year ago. Pertinent negatives include no chest pain, headaches, neck pain, palpitations or shortness of breath. Risk factors for coronary artery disease include diabetes mellitus, dyslipidemia,  obesity, male gender, sedentary lifestyle and family history. Past treatments include angiotensin blockers. Hypertensive end-organ damage includes CVA. Identifiable causes of hypertension include chronic renal disease.    Review of Systems  Constitutional:  Negative for chills, fatigue, fever and unexpected weight change.  HENT:  Negative for dental problem, mouth sores and trouble swallowing.   Eyes:  Negative for visual disturbance.  Respiratory:  Negative for cough, choking, chest tightness, shortness of breath and wheezing.   Cardiovascular:  Negative for chest pain, palpitations and leg swelling.  Gastrointestinal:  Negative for abdominal distention, abdominal pain, constipation, diarrhea, nausea and vomiting.  Endocrine: Positive for polydipsia and polyuria. Negative for polyphagia.  Genitourinary:  Negative for dysuria, flank pain, hematuria and urgency.  Musculoskeletal:  Negative for back pain, gait problem, myalgias and neck pain.  Skin:  Negative  for pallor, rash and wound.  Neurological:  Negative for seizures, syncope, weakness, numbness and headaches.  Psychiatric/Behavioral:  Negative for confusion and dysphoric mood.    Objective:    Vitals with BMI 05/03/2021 01/16/2021 01/16/2021  Height 5\' 10"  - 5\' 11"   Weight 210 lbs - 215 lbs  BMI 55.73 - 30  Systolic 220 254 270  Diastolic 94 77 77  Pulse 90 88 93    BP (!) 155/94   Pulse 90   Ht 5\' 10"  (1.778 m)   Wt 210 lb (95.3 kg)   BMI 30.13 kg/m   Wt Readings from Last 3 Encounters:  05/03/21 210 lb (95.3 kg)  01/16/21 215 lb (97.5 kg)  07/24/19 212 lb (96.2 kg)     Physical Exam Constitutional:      General: He is not in acute distress.    Appearance: He is well-developed.  HENT:     Head: Normocephalic and atraumatic.  Neck:     Thyroid: No thyromegaly.     Trachea: No tracheal deviation.  Cardiovascular:     Rate and Rhythm: Normal rate.     Pulses:          Dorsalis pedis pulses are 1+ on the right side  and 1+ on the left side.       Posterior tibial pulses are 1+ on the right side and 1+ on the left side.     Heart sounds: Normal heart sounds, S1 normal and S2 normal. No murmur heard.   No gallop.  Pulmonary:     Effort: Pulmonary effort is normal. No respiratory distress.     Breath sounds: No wheezing.  Abdominal:     General: Bowel sounds are normal. There is no distension.     Tenderness: There is no abdominal tenderness. There is no guarding.  Musculoskeletal:     Right shoulder: No swelling or deformity.     Cervical back: Normal range of motion and neck supple.  Skin:    General: Skin is warm and dry.     Findings: No rash.     Nails: There is no clubbing.     Comments: Dry skin, calluses on bilateral feet.    Neurological:     Mental Status: He is alert and oriented to person, place, and time.     Cranial Nerves: No cranial nerve deficit.     Sensory: No sensory deficit.     Gait: Gait normal.     Deep Tendon Reflexes: Reflexes are normal and symmetric.  Psychiatric:        Speech: Speech normal.        Behavior: Behavior normal. Behavior is cooperative.        Thought Content: Thought content normal.        Judgment: Judgment normal.      CMP ( most recent) CMP     Component Value Date/Time   NA 135 07/24/2019 0602   K 3.9 07/24/2019 0602   CL 99 07/24/2019 0602   CO2 27 07/24/2019 0602   GLUCOSE 134 (H) 07/24/2019 0602   BUN 20 11/02/2020 0000   CREATININE 1.9 (A) 11/02/2020 0000   CREATININE 1.42 (H) 07/24/2019 0602   CALCIUM 9.7 07/24/2019 0602   PROT 7.3 07/24/2019 0602   ALBUMIN 3.9 07/24/2019 0602   AST 17 07/24/2019 0602   ALT 26 07/24/2019 0602   ALKPHOS 63 07/24/2019 0602   BILITOT 1.0 07/24/2019 0602   GFRNONAA 55 (L) 07/24/2019 0602  GFRAA >60 07/24/2019 0602     Diabetic Labs (most recent): Lab Results  Component Value Date   HGBA1C 10.3 03/30/2021   HGBA1C 11.3 11/02/2020    Lipid Panel     Component Value Date/Time   CHOL  268 (A) 11/02/2020 0000   TRIG 288 (A) 11/02/2020 0000   HDL 47 11/02/2020 0000   LDLCALC 170 11/02/2020 0000      Assessment & Plan:   1. DM type 2 causing vascular disease ([place])   - [name] [name] has currently uncontrolled symptomatic type 2 DM since  60 years of age,  with most recent A1c of 10.3 %. Recent labs reviewed. - I had a long discussion with him about the progressive nature of diabetes and the pathology behind its complications. -his diabetes is complicated by CVA, peripheral neuropathy, CKD and he remains at extremely high risk for more acute and chronic complications which include CAD, CVA, CKD, retinopathy, and neuropathy. These are all discussed in detail with him.  - I have counseled him on diet  and weight management  by adopting a carbohydrate restricted/protein rich diet. Patient is encouraged to switch to  unprocessed or minimally processed     complex starch and increased protein intake (animal or plant source), fruits, and vegetables. -  he is advised to stick to a routine mealtimes to eat 3 meals  a day and avoid unnecessary snacks ( to snack only to correct hypoglycemia).   - he acknowledges that there is a room for improvement in his food and drink choices. - Suggestion is made for him to avoid simple carbohydrates  from his diet including Cakes, Sweet Desserts, Ice Cream, Soda (diet and regular), Sweet Tea, Candies, Chips, Cookies, Store Bought Juices, Alcohol in Excess of  1-2 drinks a day, Artificial Sweeteners,  Coffee Creamer, and "Sugar-free" Products. This will help patient to have more stable blood glucose profile and potentially avoid unintended weight gain.  - he will be scheduled with Jearld Fenton, RDN, CDE for diabetes education.  - I have approached him with the following individualized plan to manage  his diabetes and patient agrees:   -   In light of his presentation with chronic, severe hyperglycemia, he will continue to benefit from multiple  daily injections of insulin in order for him to achieve control of diabetes to target.   -However, this patient would benefit from de-escalation on his insulin regimens.  He is advised to discontinue Humalog for now.  He is advised to lower his NovoLog 70/30 to 40 units with breakfast 40 units with supper for [place] blood glucose readings above 90 mg per DL,  associated with strict monitoring of glucose 4 times a day-before meals and at bedtime. - he is warned not to take insulin without proper monitoring per orders. - Adjustment parameters are given to him for hypo and hyperglycemia in writing. - he is encouraged to call clinic for blood glucose levels less than 70 or above 200 mg /dl. -Given his CKD, he will not tolerate full dose metformin.  I discussed and lowered his metformin to 500 mg p.o. twice daily. -Lifestyle medicine pillars of care discussed with him-see below.   - he he is advised to continue Trulicity 1.5 mg subcutaneously weekly.   - Specific targets for  A1c;  LDL, HDL,  and Triglycerides were discussed with the patient.  2) Blood Pressure /Hypertension:  his blood pressure is not controlled to target.   he is advised to continue his  current medications including losartan 100 mg p.o. daily, metoprolol 100 mg p.o. daily, amlodipine 10 mg p.o. daily.  3) Lipids/Hyperlipidemia:   Review of his recent lipid panel showed un controlled  LDL at 170 .  he  is advised to continue Crestor 40 mg daily at bedtime.  Side effects and precautions discussed with him.  4)  Weight/Diet:  Body mass index is 30.13 kg/m.  -   clearly complicating his diabetes care.   he is  a candidate for weight loss. I discussed with him the fact that loss of 5 - 10% of his  current body weight will have the most impact on his diabetes management.   [employer]- Lifestyle Nutrition is discussed and recommended to the patient. Optimal Exercise, Restorative Sleep  information was detailed on  discharge instructions.  5) Chronic Care/Health Maintenance:  -he  is on ACEI/ARB and Statin medications and  is encouraged to initiate and continue to follow up with Ophthalmology, Dentist,  Podiatrist at least yearly or according to recommendations, and advised to   stay away from smoking. I have recommended yearly flu vaccine and pneumonia vaccine at least every 5 years; moderate intensity exercise for up to 150 minutes weekly; and  sleep for 7- 9 hours a day.  - he is  advised to maintain close follow up with Abran Richard, MD for primary care needs, as well as his other providers for optimal and coordinated care.   I spent 66 minutes in the care of the patient today including review of labs from [place], Lipids, Thyroid Function, Hematology (current and previous including abstractions from other facilities); face-to-face time discussing  his blood glucose readings/logs, discussing hypoglycemia and hyperglycemia episodes and symptoms, medications doses, his options of short and long term treatment based on the latest standards of care / guidelines;  discussion about incorporating lifestyle medicine;  and documenting the encounter.     Please refer to Patient Instructions for Blood Glucose Monitoring and Insulin/Medications Dosing Guide"  in media tab for additional information. Please  also refer to " Patient Self Inventory" in the Media  tab for reviewed elements of pertinent patient history.  Alic Hawkey participated in the discussions, expressed understanding, and voiced agreement with the above plans.  All questions were answered to his satisfaction. he is encouraged to contact clinic should he have any questions or concerns prior to his return visit.   Follow up plan: - Return in about 1 week (around 05/10/2021) for F/U with Meter and Logs Only - no Labs.  [name] Lloyd, MD [facility] [address] [place], [place] [address] Phone:  [phone]  Fax: [phone]    05/03/2021, 12:56 PM  This note was partially dictated with voice recognition software. Similar sounding words can be transcribed inadequately or may not  be corrected upon review.

## 2021-05-11 ENCOUNTER — Ambulatory Visit: Payer: 59 | Admitting: "Endocrinology

## 2021-05-19 ENCOUNTER — Ambulatory Visit: Payer: 59 | Admitting: Nutrition

## 2021-06-24 ENCOUNTER — Encounter (HOSPITAL_COMMUNITY): Payer: Self-pay | Admitting: Emergency Medicine

## 2021-06-24 ENCOUNTER — Emergency Department (HOSPITAL_COMMUNITY): Payer: 59

## 2021-06-24 ENCOUNTER — Emergency Department (HOSPITAL_COMMUNITY)
Admission: EM | Admit: 2021-06-24 | Discharge: 2021-06-24 | Disposition: A | Discharge status: Home / Self Care | Payer: 59 | Attending: Emergency Medicine | Admitting: Emergency Medicine

## 2021-06-24 ENCOUNTER — Other Ambulatory Visit: Payer: Self-pay

## 2021-06-24 DIAGNOSIS — E119 Type 2 diabetes mellitus without complications: Secondary | ICD-10-CM | POA: Diagnosis not present

## 2021-06-24 DIAGNOSIS — I10 Essential (primary) hypertension: Secondary | ICD-10-CM | POA: Diagnosis present

## 2021-06-24 DIAGNOSIS — Z20822 Contact with and (suspected) exposure to covid-19: Secondary | ICD-10-CM | POA: Diagnosis not present

## 2021-06-24 DIAGNOSIS — R519 Headache, unspecified: Secondary | ICD-10-CM

## 2021-06-24 LAB — CBG MONITORING, ED: Glucose-Capillary: 392 mg/dL — ABNORMAL HIGH (ref 70–99)

## 2021-06-24 LAB — CBC
HCT: 38.5 % — ABNORMAL LOW (ref 39.0–52.0)
Hemoglobin: 13 g/dL (ref 13.0–17.0)
MCH: 29.4 pg (ref 26.0–34.0)
MCHC: 33.8 g/dL (ref 30.0–36.0)
MCV: 87.1 fL (ref 80.0–100.0)
Platelets: 257 10*3/uL (ref 150–400)
RBC: 4.42 MIL/uL (ref 4.22–5.81)
RDW: 12.6 % (ref 11.5–15.5)
WBC: 4.7 10*3/uL (ref 4.0–10.5)
nRBC: 0 % (ref 0.0–0.2)

## 2021-06-24 LAB — COMPREHENSIVE METABOLIC PANEL
ALT: 16 U/L (ref 0–44)
AST: 13 U/L — ABNORMAL LOW (ref 15–41)
Albumin: 3.8 g/dL (ref 3.5–5.0)
Alkaline Phosphatase: 98 U/L (ref 38–126)
Anion gap: 9 (ref 5–15)
BUN: 20 mg/dL (ref 6–20)
CO2: 25 mmol/L (ref 22–32)
Calcium: 9.2 mg/dL (ref 8.9–10.3)
Chloride: 97 mmol/L — ABNORMAL LOW (ref 98–111)
Creatinine, Ser: 1.77 mg/dL — ABNORMAL HIGH (ref 0.61–1.24)
GFR, Estimated: 44 mL/min — ABNORMAL LOW (ref >60)
Glucose, Bld: 499 mg/dL — ABNORMAL HIGH (ref 70–99)
Potassium: 4 mmol/L (ref 3.5–5.1)
Sodium: 131 mmol/L — ABNORMAL LOW (ref 135–145)
Total Bilirubin: 0.5 mg/dL (ref 0.3–1.2)
Total Protein: 7.1 g/dL (ref 6.5–8.1)

## 2021-06-24 LAB — RESP PANEL BY RT-PCR (FLU A&B, COVID) ARPGX2
Influenza A by PCR: NEGATIVE
Influenza B by PCR: NEGATIVE
SARS Coronavirus 2 by RT PCR: NEGATIVE

## 2021-06-24 LAB — CK: Total CK: 250 U/L (ref 49–397)

## 2021-06-24 LAB — TROPONIN I (HIGH SENSITIVITY): Troponin I (High Sensitivity): 8 ng/L (ref <18)

## 2021-06-24 MED ORDER — IOHEXOL 350 MG/ML SOLN
100.0000 mL | Freq: Once | INTRAVENOUS | Status: AC | PRN
  Administered 2021-06-24: 75 mL via INTRAVENOUS

## 2021-06-24 MED ORDER — INSULIN ASPART 100 UNIT/ML IJ SOLN
3.0000 [IU] | Freq: Once | INTRAMUSCULAR | Status: AC
  Administered 2021-06-24: 3 [IU] via SUBCUTANEOUS
  Filled 2021-06-24: qty 1

## 2021-06-24 NOTE — Discharge Instructions (Addendum)
You were evaluated in the Emergency Department and after careful evaluation, we did not find any emergent condition requiring admission or further testing in the hospital.  Your exam/testing today was overall reassuring.  Recommend follow-up with your primary care doctor to discuss your blood pressure and blood pressure medications.  Recommend follow-up with a neurologist as well.  Please return to the Emergency Department if you experience any worsening of your condition.  Thank you for allowing [place] to be a part of your care.

## 2021-06-24 NOTE — ED Notes (Signed)
Pt back from CT

## 2021-06-24 NOTE — ED Provider Notes (Signed)
[facility] Emergency Department Provider Note MRN:  [id number]  Arrival date & time: 06/24/21     Chief Complaint   Hypertension   History of Present Illness   [name] [name] is a 61 y.o. year-old male with a history of hemorrhagic stroke, diabetes, hypertension presenting to the ED with chief complaint of hypertension.  Persistently elevated blood pressures over the past weeks to months.  Headache today that was sudden onset at 28 AM, went away but then came back this evening at 11 PM.  Headache located behind the left eye.  This worried him given his history of hemorrhagic stroke.  Denies neck pain, no chest pain or shortness of breath, no abdominal pain, no numbness or weakness in the arms or legs.  Endorsing soreness to the muscles diffusely today as well.  Review of Systems  A thorough review of systems was obtained and all systems are negative except as noted in the HPI and PMH.   Patient's Health History    Past Medical History:  Diagnosis Date   CVA (cerebral vascular accident) ([place]) 2018   Diabetes ([place])    Diabetes mellitus, type II ([place])    H. pylori infection    Hypercholesteremia    Hypertension     Past Surgical History:  Procedure Laterality Date   VASECTOMY  69    Family History  Problem Relation Age of Onset   Diabetes Mother    Colon cancer Neg Hx    Stomach cancer Neg Hx    Rectal cancer Neg Hx    Esophageal cancer Neg Hx     Social History   Socioeconomic History   Marital status: Married    Spouse name: Not on file   Number of children: Not on file   Years of education: Not on file   Highest education level: Not on file  Occupational History   Not on file  Tobacco Use   Smoking status: Never   Smokeless tobacco: Never  Vaping Use   Vaping Use: Never used  Substance and Sexual Activity   Alcohol use: Not Currently    Comment: stopped drinking alcohol in 2009   Drug use: Never   Sexual activity: Not on file   Other Topics Concern   Not on file  Social History Narrative   Not on file   Social Determinants of Health   Financial Resource Strain: Not on file  Food Insecurity: Not on file  Transportation Needs: Not on file  Physical Activity: Not on file  Stress: Not on file  Social Connections: Not on file  Intimate Partner Violence: Not on file     Physical Exam   Vitals:   06/24/21 0300 06/24/21 0330  BP: (!) 143/81 (!) 143/81  Pulse: 71 69  Resp: 17 18  Temp:    SpO2: 100% 99%    CONSTITUTIONAL: Well-appearing, NAD NEURO/PSYCH:  Alert and oriented x 3, normal and symmetric strength and sensation, normal coordination, normal speech EYES:  eyes equal and reactive, normal extraocular movements ENT/NECK:  no LAD, no JVD CARDIO:   rate, well-perfused, normal S1 and S2 PULM:  CTAB no wheezing or rhonchi GI/GU:  non-distended, non-tender MSK/SPINE:  No gross deformities, no edema SKIN:  no rash, atraumatic   *Additional and/or pertinent findings included in MDM below  Diagnostic and Interventional Summary    EKG Interpretation  Date/Time:    Ventricular Rate:    PR Interval:    QRS Duration:   QT Interval:  QTC Calculation:   R Axis:     Text Interpretation:         Labs Reviewed  CBC - Abnormal; Notable for the following components:      Result Value   HCT 38.5 (*)    All other components within normal limits  COMPREHENSIVE METABOLIC PANEL - Abnormal; Notable for the following components:   Sodium 131 (*)    Chloride 97 (*)    Glucose, Bld 499 (*)    Creatinine, Ser 1.77 (*)    AST 13 (*)    GFR, Estimated 44 (*)    All other components within normal limits  CBG MONITORING, ED - Abnormal; Notable for the following components:   Glucose-Capillary 392 (*)    All other components within normal limits  RESP PANEL BY RT-PCR (FLU A&B, COVID) ARPGX2  CK  TROPONIN I (HIGH SENSITIVITY)    CT ANGIO HEAD NECK W WO CM  Final Result      Medications  iohexol  (OMNIPAQUE) 350 MG/ML injection 100 mL (75 mLs Intravenous Contrast Given 06/24/21 0202)  insulin aspart (novoLOG) injection 3 Units (3 Units Subcutaneous Given 06/24/21 0237)     Procedures  /  Critical Care Procedures  ED Course and Medical Decision Making  Initial Impression and Ddx Given patient's history of hemorrhagic stroke and hypertension and sudden onset of headache will obtain CTA imaging of the head and neck to evaluate for hemorrhagic stroke, subarachnoid hemorrhage, or any other vascular abnormality.  Overall suspect this is more of a chronic hypertension with a benign headache today.  EKG is of similar morphology, no chest pain.  Will evaluate labs for any signs of endorgan damage.  Past medical/surgical history that increases complexity of ED encounter: History of hemorrhagic stroke  Interpretation of Diagnostics I personally reviewed the EKG and my interpretation is as follows: Diffuse Q wave pattern, largely unchanged from prior.    CTA imaging is without acute process, there is evidence of vessel disease.  Labs are reassuring with negative troponin.  Patient Reassessment and Ultimate Disposition/Management On reassessment patient continues to feel well, blood pressure has improved to 140s over 80s.  Appropriate for discharge.  Patient management required discussion with the following services or consulting groups:  None  Complexity of Problems Addressed Chronic illness with exacerbation  Additional Data Reviewed and Analyzed Further history obtained from: Further history from spouse/family member  Patient Encounter Risk Assessment High:  Consideration of hospitalization  Barth Kirks. Sedonia Small, MD [place] [email or web address]  Final Clinical Impressions(s) / ED Diagnoses     ICD-10-CM   1. Hypertension, unspecified type  I10     2. Acute nonintractable headache, unspecified headache type  R51.9       ED  Discharge Orders     None        Discharge Instructions Discussed with and Provided to Patient:     Discharge Instructions      You were evaluated in the Emergency Department and after careful evaluation, we did not find any emergent condition requiring admission or further testing in the hospital.  Your exam/testing today was overall reassuring.  Recommend follow-up with your primary care doctor to discuss your blood pressure and blood pressure medications.  Recommend follow-up with a neurologist as well.  Please return to the Emergency Department if you experience any worsening of your condition.  Thank you for allowing [place] to be a part of your care.  Maudie Flakes, MD 06/24/21 [phone]

## 2021-06-24 NOTE — ED Notes (Signed)
Pt transported to CT ?

## 2021-06-24 NOTE — ED Triage Notes (Signed)
Pt c/o high blood pressure 2 days, left eye pain and muscle cramps. Pt states he is not sleeping well and urinating a lot at night.

## 2021-07-01 ENCOUNTER — Ambulatory Visit: Payer: 59 | Admitting: Nutrition

## 2021-11-27 LAB — LAB REPORT - SCANNED
A1c: 12.2
Calcium: 10.1
Creatinine, POC: 37 mg/dL
EGFR: 46
Free T4: 1.1 ng/dL
Microalb Creat Ratio: 138
Microalbumin, Urine: 5.1
PTH: 57
TSH: 1.93 (ref 0.41–5.90)

## 2021-11-29 ENCOUNTER — Other Ambulatory Visit (HOSPITAL_COMMUNITY): Payer: Self-pay | Admitting: Family Medicine

## 2021-11-29 ENCOUNTER — Other Ambulatory Visit: Payer: Self-pay | Admitting: Family Medicine

## 2021-11-29 DIAGNOSIS — R252 Cramp and spasm: Secondary | ICD-10-CM

## 2021-11-30 ENCOUNTER — Telehealth: Payer: Self-pay | Admitting: Nurse Practitioner

## 2021-11-30 NOTE — Telephone Encounter (Signed)
Attempted to contact patient to offer to schedule Palliative Consult, went straight to voicemail and mailbox was full.  I then attempted to contact wife Luellen Pucker, with no answer - left message with reason for call along with my name and contact number requesting a return call.

## 2021-11-30 NOTE — Telephone Encounter (Signed)
Ret'd call to patient's wife and discussed the Palliative referral/services with her.  She tried to connect with patient to include him on a conference with me but wasn't able to reach him.  She agreed to schedule an in-office Palliative Consult at the [facility] tomorrow, 12/01/21 @ 9:30 AM.  She will let the patient know of this appointment and will give him my number if this doesn't work for him.

## 2021-12-01 ENCOUNTER — Ambulatory Visit: Payer: 59 | Admitting: Nurse Practitioner

## 2021-12-01 ENCOUNTER — Encounter: Payer: Self-pay | Admitting: Nurse Practitioner

## 2021-12-01 DIAGNOSIS — Z515 Encounter for palliative care: Secondary | ICD-10-CM

## 2021-12-01 DIAGNOSIS — E1159 Type 2 diabetes mellitus with other circulatory complications: Secondary | ICD-10-CM

## 2021-12-01 NOTE — Progress Notes (Signed)
[facility] Pall[facility] Consult Note Telephone: [phone]  Fax: [phone]    Date of encounter: 12/01/21 4:01 PM PATIENT NAME: [name] [name] [name] [name] [name] [address] [place] [address]   [phone] (home)  DOB: [birth year] MRN: [id number] PRIMARY CARE PROVIDER:    Abran Richard, MD,  [address] [place] [address] [phone]  REFERRING PROVIDER:   Abran Richard, MD [address] [place],  [place] [address] [phone]  RESPONSIBLE PARTY:    Contact Information     Name Relation Home Work [name] [name] [name] [name] [name] Spouse   [phone]      I met face to face with patient in office. Palliative Care was asked to follow this patient by consultation request of  Abran Richard, MD to address advance care planning and complex medical decision making. This is a follow up visit.                                  ASSESSMENT AND PLAN / RECOMMENDATIONS:  Advance Care Planning/Goals of Care: Goals include to maximize quality of life and symptom management. Patient/health care surrogate gave his/her permission to discuss. Our advance care planning conversation included a discussion about:    The value and importance of advance care planning  Experiences with loved ones who have been seriously ill or have died  Exploration of personal, cultural or spiritual beliefs that might influence medical decisions  Exploration of goals of care in the event of a sudden injury or illness  Identification of a healthcare agent  Review and updating or creation of an  advance directive document . Decision not to resuscitate or to de-escalate disease focused treatments due to poor prognosis. CODE STATUS: Wishes to be DNR, has living will;  Symptom Management/Plan: 1. Advance Care Planning;  Wishes to be DNR, has living will; would like to further discuss with wife before completing a goldenrod form or MOST form Blank MOST form given.   2. Goals of  Care: Goals include to maximize quality of life and symptom management. Our advance care planning conversation included a discussion about:    The value and importance of advance care planning  Exploration of personal, cultural or spiritual beliefs that might influence medical decisions  Exploration of goals of care in the event of a sudden injury or illness  Identification and preparation of a healthcare agent  Review and updating or creation of an advance directive document  3. Uncontrolled DM with HAIC 12; discussed at length medications including oral and insulin therapy. Goals set for exercise, medications, diet with nutritional counseling, Elevated bp's discussed to continue current medications, monitor home bp's.   4. Palliative care encounter; Palliative care encounter; Palliative medicine team will continue to support patient, patient's family, and medical team. Visit consisted of counseling and education dealing with the complex and emotionally intense issues of symptom management and palliative care in the setting of serious and potentially life-threatening illness  Follow up Palliative Care Visit: Palliative care will continue to follow for complex medical decision making, advance care planning, and clarification of goals. Return 12 weeks or prn.  I spent 46 minutes providing this consultation. More than 50% of the time in this consultation was spent in counseling and care coordination. PPS: 70% Chief Complaint: Initial palliative consult for complex medical decision making, address goals, manage ongoing symptoms HISTORY OF PRESENT ILLNESS:  [name] [name] [name] [name] [name]  is a 61 y.o. year old male  with multiple medial problems including hemorraghic stroke, HTN, uncontrolled DM. I met [name] [name] [name] [name] [name] in the Palliative clinic at [facility]. We talked about life review, past medical history, retirement, family, social hx. We talked about uncontrolled HTN and DM at length with risk factors including how it  affects the kidneys, limbs, heart, all aspects of his body. We talked about chronic disease progression of uncontrolled DM. We talked about importance of diet; exercise and goals put in place to motivate [name] [name] [name] [name] [name] to be more active. [name] [name]. [name] [name] [name] endorses he has already lost 10 lbs. We talked about concerns about elevated BP in the setting of h/o hemorrhagic CVA. We talked about retirement from [employer]. We talked about [name] [name] [name] [name] [name] moving back to be with his wife, though finding retirement challenging as ability for exercise, motivation to do so. [name] [name] [name] [name] [name] endorses he is looking for a job to keep him active. We talked about quality of life, including [name] [name] [name] [name] [name] endorses he has a living will with wishes to be DNR. Reviewed MOST form, [name] [name] [name] [name] [name] endorses he would like to further review prior to completing with his wife. We talked about role pc in poc. F/u appointment scheduled with wishes for after next HAIC. [name] [name] [name] [name] [name] verbalized understanding he can call sooner if symptoms arise or further discussion of MOST form. Questions answered. Therapeutic listening, emotional support provided  History obtained from review of EMR, discussion with [name] [name] [name] [name] [name].  I reviewed available labs, medications, imaging, studies and related documents from the EMR.  Records reviewed and summarized above.  ROS 10 point system reviewed all negative except HPI Physical Exam: Constitutional: NAD General: pleasant male EYES: lids intact ENMT: oral mucous membranes moist CV: S1S2, RRR Pulmonary: LCTA, no increased work of breathing, no cough, room air MSK: ambulatory Skin: warm and dry,  Neuro:  no generalized weakness,  no cognitive impairment Psych: alert, oriented Thank you for the opportunity to participate in the care of [name] [name] [name] [name] [name].  The palliative care team will continue to follow. Please call our office at [phone] if we can be of additional assistance.   Kalliopi Coupland Ihor Gully, NP

## 2021-12-17 ENCOUNTER — Ambulatory Visit (HOSPITAL_COMMUNITY)
Admission: RE | Admit: 2021-12-17 | Discharge: 2021-12-17 | Disposition: A | Discharge status: Home / Self Care | Payer: 59 | Source: Ambulatory Visit | Attending: Family Medicine | Admitting: Family Medicine

## 2021-12-17 DIAGNOSIS — R252 Cramp and spasm: Secondary | ICD-10-CM | POA: Diagnosis present

## 2022-01-06 ENCOUNTER — Ambulatory Visit: Payer: 59 | Admitting: Cardiovascular Disease

## 2022-01-08 ENCOUNTER — Encounter (HOSPITAL_COMMUNITY): Payer: Self-pay

## 2022-01-08 ENCOUNTER — Other Ambulatory Visit: Payer: Self-pay

## 2022-01-08 ENCOUNTER — Emergency Department (HOSPITAL_COMMUNITY)
Admission: EM | Admit: 2022-01-08 | Discharge: 2022-01-08 | Disposition: A | Discharge status: Home / Self Care | Payer: 59 | Attending: Emergency Medicine | Admitting: Emergency Medicine

## 2022-01-08 DIAGNOSIS — Z79899 Other long term (current) drug therapy: Secondary | ICD-10-CM | POA: Diagnosis not present

## 2022-01-08 DIAGNOSIS — T25221A Burn of second degree of right foot, initial encounter: Secondary | ICD-10-CM | POA: Diagnosis not present

## 2022-01-08 DIAGNOSIS — T25222A Burn of second degree of left foot, initial encounter: Secondary | ICD-10-CM | POA: Diagnosis not present

## 2022-01-08 DIAGNOSIS — Z794 Long term (current) use of insulin: Secondary | ICD-10-CM | POA: Insufficient documentation

## 2022-01-08 DIAGNOSIS — E119 Type 2 diabetes mellitus without complications: Secondary | ICD-10-CM | POA: Diagnosis not present

## 2022-01-08 DIAGNOSIS — X110XXA Contact with hot water in bath or tub, initial encounter: Secondary | ICD-10-CM | POA: Diagnosis not present

## 2022-01-08 DIAGNOSIS — T25021A Burn of unspecified degree of right foot, initial encounter: Secondary | ICD-10-CM | POA: Diagnosis present

## 2022-01-08 DIAGNOSIS — Z7984 Long term (current) use of oral hypoglycemic drugs: Secondary | ICD-10-CM | POA: Insufficient documentation

## 2022-01-08 DIAGNOSIS — I10 Essential (primary) hypertension: Secondary | ICD-10-CM | POA: Diagnosis not present

## 2022-01-08 MED ORDER — BACITRACIN ZINC 500 UNIT/GM EX OINT: 1.0000 | TOPICAL_OINTMENT | Freq: Two times a day (BID) | CUTANEOUS | 0 refills | Status: DC

## 2022-01-08 NOTE — ED Notes (Signed)
ED Provider at bedside. 
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
## 2022-01-17 ENCOUNTER — Encounter: Payer: Self-pay | Admitting: Gastroenterology

## 2022-01-25 ENCOUNTER — Ambulatory Visit: Payer: 59 | Admitting: Cardiology

## 2022-01-25 ENCOUNTER — Encounter: Payer: Self-pay | Admitting: Cardiology

## 2022-01-25 VITALS — BP 140/90 | HR 95 | Ht 71.0 in | Wt 203.4 lb

## 2022-01-25 DIAGNOSIS — R9431 Abnormal electrocardiogram [ECG] [EKG]: Secondary | ICD-10-CM

## 2022-01-25 DIAGNOSIS — Z8679 Personal history of other diseases of the circulatory system: Secondary | ICD-10-CM | POA: Diagnosis not present

## 2022-01-25 DIAGNOSIS — I10 Essential (primary) hypertension: Secondary | ICD-10-CM

## 2022-01-25 DIAGNOSIS — E782 Mixed hyperlipidemia: Secondary | ICD-10-CM

## 2022-01-25 MED ORDER — IRBESARTAN 300 MG PO TABS: 300.0000 mg | ORAL_TABLET | Freq: Every day | ORAL | 3 refills | Status: DC

## 2022-01-25 MED ORDER — METOPROLOL SUCCINATE ER 100 MG PO TB24: 100.0000 mg | ORAL_TABLET | Freq: Every day | ORAL | 3 refills | Status: DC
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
## 2022-01-25 NOTE — Patient Instructions (Signed)
Medication Instructions:  Please stop Losartan and start Irbesartan 300 mg daily. Stop Metoprolol Tartrate and start Metoprolol Succinate 100 mg once a day. Continue all other medications as lisetd.  *If you need a refill on your cardiac medications before your next appointment, please call your pharmacy*   Testing/Procedures: Your physician has requested that you have an echocardiogram at [facility]. Echocardiography is a painless test that uses sound waves to create images of your heart. It provides your doctor with information about the size and shape of your heart and how well your heart's chambers and valves are working. This procedure takes approximately one hour. There are no restrictions for this procedure.  Follow-Up: At [facility], you and your health needs are our priority.  As part of our continuing mission to provide you with exceptional heart care, we have created designated Provider Care Teams.  These Care Teams include your primary Cardiologist (physician) and Advanced Practice Providers (APPs -  Physician Assistants and Nurse Practitioners) who all work together to provide you with the care you need, when you need it.  We recommend signing up for the patient portal called "MyChart".  Sign up information is provided on this After Visit Summary.  MyChart is used to connect with patients for Virtual Visits (Telemedicine).  Patients are able to view lab/test results, encounter notes, upcoming appointments, etc.  Non-urgent messages can be sent to your provider as well.   To learn more about what you can do with MyChart, go to [email or web address].    Your next appointment:   1 year(s)  The format for your next appointment:   In Person  Provider:   Dr Candee Furbish  Important Information About Sugar

## 2022-02-07 ENCOUNTER — Telehealth: Payer: Self-pay | Admitting: "Endocrinology

## 2022-02-07 NOTE — Telephone Encounter (Signed)
Patient would like to sch a f/u. Last office visit was 11/22. Do you want update labs?

## 2022-02-08 ENCOUNTER — Other Ambulatory Visit: Payer: Self-pay | Admitting: "Endocrinology

## 2022-02-08 ENCOUNTER — Telehealth: Payer: Self-pay

## 2022-02-08 ENCOUNTER — Ambulatory Visit (AMBULATORY_SURGERY_CENTER): Payer: 59

## 2022-02-08 VITALS — Ht 70.0 in | Wt 203.0 lb

## 2022-02-08 DIAGNOSIS — Z1211 Encounter for screening for malignant neoplasm of colon: Secondary | ICD-10-CM

## 2022-02-08 DIAGNOSIS — E1159 Type 2 diabetes mellitus with other circulatory complications: Secondary | ICD-10-CM

## 2022-02-08 MED ORDER — NA SULFATE-K SULFATE-MG SULF 17.5-3.13-1.6 GM/177ML PO SOLN: 1.0000 | ORAL | 0 refills | Status: DC

## 2022-02-08 NOTE — Progress Notes (Addendum)
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 TE to Dr. BTarri Glennand JOsvaldo AngstCRNA.  Pt instructed to use Singlecare.com or GoodRx for a price reduction on prep

## 2022-02-08 NOTE — Telephone Encounter (Signed)
PV today for colon scheduled 02/22/22 reveals Echo scheduled for 02/17/22 at [facility].  Cardiology note 8/15 plans 1 year f/u.   (Pt reports q64yrecho [place] part [place] and [place] Ok to proceed with colonoscopy 9/12 if echo normal on 9/7? Thanks, BAaron Edelman(PV)

## 2022-02-16 LAB — COMPREHENSIVE METABOLIC PANEL
ALT: 23 IU/L (ref 0–44)
AST: 13 IU/L (ref 0–40)
Albumin/Globulin Ratio: 1.5 (ref 1.2–2.2)
Albumin: 4.4 g/dL (ref 3.8–4.9)
Alkaline Phosphatase: 90 IU/L (ref 44–121)
BUN/Creatinine Ratio: 10 (ref 9–20)
BUN: 16 mg/dL (ref 6–24)
Bilirubin Total: 0.7 mg/dL (ref 0.0–1.2)
CO2: 23 mmol/L (ref 20–29)
Calcium: 9.7 mg/dL (ref 8.7–10.2)
Chloride: 100 mmol/L (ref 96–106)
Creatinine, Ser: 1.62 mg/dL — ABNORMAL HIGH (ref 0.76–1.27)
Globulin, Total: 2.9 g/dL (ref 1.5–4.5)
Glucose: 299 mg/dL — ABNORMAL HIGH (ref 70–99)
Potassium: 4.7 mmol/L (ref 3.5–5.2)
Sodium: 137 mmol/L (ref 134–144)
Total Protein: 7.3 g/dL (ref 6.0–8.5)
eGFR: 49 mL/min/{1.73_m2} — ABNORMAL LOW (ref >59)

## 2022-02-16 LAB — LIPID PANEL
Chol/HDL Ratio: 4.6 ratio (ref 0.0–5.0)
Cholesterol, Total: 216 mg/dL — ABNORMAL HIGH (ref 100–199)
HDL: 47 mg/dL (ref >39)
LDL Chol Calc (NIH): 142 mg/dL — ABNORMAL HIGH (ref 0–99)
Triglycerides: 148 mg/dL (ref 0–149)
VLDL Cholesterol Cal: 27 mg/dL (ref 5–40)

## 2022-02-16 LAB — TSH: TSH: 2.52 u[IU]/mL (ref 0.450–4.500)

## 2022-02-16 LAB — T4, FREE: Free T4: 1.08 ng/dL (ref 0.82–1.77)

## 2022-02-17 ENCOUNTER — Ambulatory Visit (HOSPITAL_COMMUNITY)
Admission: RE | Admit: 2022-02-17 | Discharge: 2022-02-17 | Disposition: A | Discharge status: Home / Self Care | Payer: 59 | Source: Ambulatory Visit | Attending: Cardiology | Admitting: Cardiology

## 2022-02-17 DIAGNOSIS — R9431 Abnormal electrocardiogram [ECG] [EKG]: Secondary | ICD-10-CM

## 2022-02-17 DIAGNOSIS — I10 Essential (primary) hypertension: Secondary | ICD-10-CM | POA: Diagnosis present

## 2022-02-17 LAB — ECHOCARDIOGRAM COMPLETE
AR max vel: 2.04 cm2
AV Area VTI: 2.02 cm2
AV Area mean vel: 2 cm2
AV Mean grad: 3.6 mmHg
AV Peak grad: 6.8 mmHg
Ao pk vel: 1.3 m/s
Area-P 1/2: 2.76 cm2
Calc EF: 53.1 %
S' Lateral: 3.1 cm
Single Plane A2C EF: 53.6 %
Single Plane A4C EF: 56.5 %

## 2022-02-17 NOTE — Progress Notes (Signed)
*  PRELIMINARY RESULTS* Echocardiogram 2D Echocardiogram has been performed.  Samuel Germany 02/17/2022, 9:05 AM

## 2022-02-18 ENCOUNTER — Telehealth: Payer: Self-pay | Admitting: Cardiology

## 2022-02-18 NOTE — Telephone Encounter (Signed)
Pt is returning call in regards to echo results. Transferred to RN.

## 2022-02-18 NOTE — Telephone Encounter (Signed)
Reviewed Echo from 02/17/22. LVEF 50-55 % No AV stenosis. Ok to proceed with procedure.

## 2022-02-18 NOTE — Telephone Encounter (Signed)
Pt notified of test results

## 2022-02-21 ENCOUNTER — Ambulatory Visit: Payer: 59 | Admitting: "Endocrinology

## 2022-02-21 ENCOUNTER — Encounter: Payer: Self-pay | Admitting: Gastroenterology

## 2022-02-22 ENCOUNTER — Encounter: Payer: Self-pay | Admitting: Gastroenterology

## 2022-02-22 ENCOUNTER — Ambulatory Visit (AMBULATORY_SURGERY_CENTER): Payer: 59 | Admitting: Gastroenterology

## 2022-02-22 VITALS — BP 130/79 | HR 99 | Temp 98.2°F | Resp 27 | Ht 70.0 in | Wt 203.0 lb

## 2022-02-22 DIAGNOSIS — D123 Benign neoplasm of transverse colon: Secondary | ICD-10-CM | POA: Diagnosis not present

## 2022-02-22 DIAGNOSIS — Z86010 Personal history of colonic polyps: Secondary | ICD-10-CM

## 2022-02-22 DIAGNOSIS — Z09 Encounter for follow-up examination after completed treatment for conditions other than malignant neoplasm: Secondary | ICD-10-CM

## 2022-02-22 DIAGNOSIS — D122 Benign neoplasm of ascending colon: Secondary | ICD-10-CM

## 2022-02-22 DIAGNOSIS — Z1211 Encounter for screening for malignant neoplasm of colon: Secondary | ICD-10-CM

## 2022-02-22 MED ORDER — SODIUM CHLORIDE 0.9 % IV SOLN: 500.0000 mL | Freq: Once | INTRAVENOUS | Status: DC

## 2022-02-22 MED ORDER — AMOXICILLIN-POT CLAVULANATE 875-125 MG PO TABS: 1.0000 | ORAL_TABLET | Freq: Two times a day (BID) | ORAL | 0 refills | Status: DC

## 2022-02-22 NOTE — Progress Notes (Signed)
At [phone], tech giving abd pressure to reach cecum, Pt started "violently" throwing up large amounts of liquid.  Sedation halted, HOB dropped to max t-burg, suctioning started, O2 to 15L.  Emesis coming out faster than suction could handle (nose and mouth).  350cc in canister at end but pillow case saturated and some on bed and in floor.  The emesis was bile colored and had small solid flakes.  Pt allowed to wake yp approx 0845.  Could cough and swallow and open mouth for deeper suctioning.  Sats did drop per chart. Initially could hear the breath sounds as pt breathing.  As he continued to cough, I did chest pt and suctioned and at the end, Right (top) lung sounded fairly clear and bottom lung still had some rales/crackles.  Dr B aware.  Situation explained to pt in room amd in PACU.  PACU nurse aware and pt left on 3L O2 in PACU.  Sats were 95

## 2022-02-22 NOTE — Progress Notes (Signed)
Called to room to assist during endoscopic procedure.  Patient ID and intended procedure confirmed with present staff. Received instructions for my participation in the procedure from the performing physician.  

## 2022-02-22 NOTE — Patient Instructions (Signed)
   Handouts provided about hemorrhoids and polyps.  Resume previous diet.  Continue present medications.  Start Augmentin 875 mg twice a day for 10 days.  Consider outpatient evaluation for gastroparesis.     YOU HAD AN ENDOSCOPIC PROCEDURE TODAY AT [facility]:   Refer to the procedure report that was given to you for any specific questions about what was found during the examination.  If the procedure report does not answer your questions, please call your gastroenterologist to clarify.  If you requested that your care partner not be given the details of your procedure findings, then the procedure report has been included in a sealed envelope for you to review at your convenience later.  YOU SHOULD EXPECT: Some feelings of bloating in the abdomen. Passage of more gas than usual.  Walking can help get rid of the air that was put into your GI tract during the procedure and reduce the bloating. If you had a lower endoscopy (such as a colonoscopy or flexible sigmoidoscopy) you may notice spotting of blood in your stool or on the toilet paper. If you underwent a bowel prep for your procedure, you may not have a normal bowel movement for a few days.  Please Note:  You might notice some irritation and congestion in your nose or some drainage.  This is from the oxygen used during your procedure.  There is no need for concern and it should clear up in a day or so.  SYMPTOMS TO REPORT IMMEDIATELY:  Following lower endoscopy (colonoscopy or flexible sigmoidoscopy):  Excessive amounts of blood in the stool  Significant tenderness or worsening of abdominal pains  Swelling of the abdomen that is new, acute  Fever of 100F or higher   For urgent or emergent issues, a gastroenterologist can be reached at any hour by calling [phone]. Do not use MyChart messaging for urgent concerns.    DIET:  We do recommend a small meal at first, but then you may proceed to your regular diet.  Drink  plenty of fluids but you should avoid alcoholic beverages for 24 hours.  ACTIVITY:  You should plan to take it easy for the rest of today and you should NOT DRIVE or use heavy machinery until tomorrow (because of the sedation medicines used during the test).    FOLLOW UP: Our staff will call the number listed on your records the next business day following your procedure.  We will call around 7:15- 8:00 am to check on you and address any questions or concerns that you may have regarding the information given to you following your procedure. If we do not reach you, we will leave a message.     If any biopsies were taken you will be contacted by phone or by letter within the next 1-3 weeks.  Please call us at [phone] if you have not heard about the biopsies in 3 weeks.    SIGNATURES/CONFIDENTIALITY: You and/or your care partner have signed paperwork which will be entered into your electronic medical record.  These signatures attest to the fact that that the information above on your After Visit Summary has been reviewed and is understood.  Full responsibility of the confidentiality of this discharge information lies with you and/or your care-partner.

## 2022-02-22 NOTE — Progress Notes (Signed)
Referring Provider: Abran Richard, MD Primary Care Physician:  Danna Hefty, DO  Indication for Procedure:  Colon cancer Surveillance   IMPRESSION:  History of colon polyps - tubular adenoma and hyperplastic polyp Need for colon cancer surveillance Appropriate candidate for monitored anesthesia care  PLAN: Colonoscopy in the [place] today   HPI: [name] [name] is a 61 y.o. male presents for surveillance colonoscopy.  Prior endoscopic history: Colonoscopy 02/09/17 with Dr. Cheral Marker in [place] showed a 8 mm tubular adenoma and a 60m hyperplastic polyp. . Five year surveillance colonoscopy recommended.   No baseline GI symptoms.   No known family history of colon cancer or polyps. No family history of uterine/endometrial cancer, pancreatic cancer or gastric/stomach cancer.   Past Medical History:  Diagnosis Date   [place]   [place]    [place] mellitus, type II [place]    H. pylori infection    Hypercholesteremia    Hypertension     Past Surgical History:  Procedure Laterality Date   VASECTOMY  1996    Current Outpatient Medications  Medication Sig Dispense Refill   amLODipine (NORVASC) 10 MG tablet Take 1 tablet by mouth daily.     chlorthalidone (HYGROTON) 25 MG tablet Take 25 mg by mouth every morning.     Dulaglutide (TRULICITY) 1.5 MLO/7.5IESOPN Inject into the skin once a week.     ezetimibe (ZETIA) 10 MG tablet Take 10 mg by mouth daily.     HUMALOG MIX 75/25 KWIKPEN (75-25) 100 UNIT/ML KwikPen SMARTSIG:60 Unit(s) SUB-Q Twice Daily     insulin aspart protamine- aspart (NOVOLOG MIX 70/30) (70-30) 100 UNIT/ML injection Inject 40 Units into the skin 2 (two) times daily before a meal.     irbesartan (AVAPRO) 300 MG tablet Take 1 tablet (300 mg total) by mouth daily. 90 tablet 3   JARDIANCE 25 MG TABS tablet Take 25 mg by mouth daily.     metFORMIN (GLUCOPHAGE) 500 MG tablet Take 500 mg by mouth 2 (two) times daily after a  meal.     metoprolol succinate (TOPROL-XL) 100 MG 24 hr tablet Take 1 tablet (100 mg total) by mouth daily. Take with or immediately following a meal. 90 tablet 3   Na Sulfate-K Sulfate-Mg Sulf 17.5-3.13-1.6 GM/177ML SOLN Take 1 kit by mouth as directed. May use generic Suprep, no prior authorization. Take as directed. 354 mL 0   naproxen (NAPROSYN) 500 MG tablet Take 1 tablet (500 mg total) by mouth 2 (two) times daily with a meal. 30 tablet 0   pantoprazole (PROTONIX) 40 MG tablet TAKE 1 TABLET BY MOUTH EVERY DAY 90 tablet 1   rosuvastatin (CRESTOR) 40 MG tablet Take 40 mg by mouth daily.     sildenafil (VIAGRA) 100 MG tablet Take 100 mg by mouth daily as needed for erectile dysfunction.     silver sulfADIAZINE (SILVADENE) 1 % cream Apply 1 application topically daily. 50 g 2   sitaGLIPtin (JANUVIA) 100 MG tablet Take 100 mg by mouth daily.     tamsulosin (FLOMAX) 0.4 MG CAPS capsule Take 0.4 mg by mouth at bedtime.     No current facility-administered medications for this visit.    Allergies as of 02/22/2022 - Review Complete 02/08/2022  Allergen Reaction Noted   Morphine and related Hives 01/16/2021    Family History  Problem Relation Age of Onset   Diabetes Mother    Colon cancer Neg Hx    Stomach cancer Neg Hx  Rectal cancer Neg Hx    Esophageal cancer Neg Hx      Physical Exam: General:   Alert,  well-nourished, pleasant and cooperative in NAD Head:  Normocephalic and atraumatic. Eyes:  Sclera clear, no icterus.   Conjunctiva pink. Mouth:  No deformity or lesions.   Neck:  Supple; no masses or thyromegaly. Lungs:  Clear throughout to auscultation.   No wheezes. Heart:  Regular rate and rhythm; no murmurs. Abdomen:  Soft, non-tender, nondistended, normal bowel sounds, no rebound or guarding.  Msk:  Symmetrical. No boney deformities LAD: No inguinal or umbilical LAD Extremities:  No clubbing or edema. Neurologic:  Alert and  oriented x4;  grossly nonfocal Skin:  No  obvious rash or bruise. Psych:  Alert and cooperative. Normal mood and affect.     Studies/Results: No results found.    [name] [name] L. Tarri Glenn, MD, MPH 02/22/2022, 7:08 AM

## 2022-02-22 NOTE — Progress Notes (Signed)
Pt's states no medical or surgical changes since previsit or office visit. 

## 2022-02-22 NOTE — Op Note (Signed)
[place] Patient Name: [name] [name] Procedure Date: 02/22/2022 8:28 AM MRN: [id number] Endoscopist: Thornton Park MD, MD Age: 61 Referring MD:  Date of Birth: 01/15/1961 Gender: Male Account #: [id number] Procedure:                Colonoscopy Indications:              Surveillance: Personal history of adenomatous                            polyps on last colonoscopy 5 years ago                           Colonoscopy 02/09/17 with Dr. Cheral Marker in [place]                            showed a 8 mm tubular adenoma and a 37m                            hyperplastic polyp. . Five year surveillance                            colonoscopy recommended. Medicines:                Monitored Anesthesia Care Procedure:                Pre-Anesthesia Assessment:                           - Prior to the procedure, a History and Physical                            was performed, and patient medications and                            allergies were reviewed. The patient's tolerance of                            previous anesthesia was also reviewed. The risks                            and benefits of the procedure and the sedation                            options and risks were discussed with the patient.                            All questions were answered, and informed consent                            was obtained. Prior Anticoagulants: The patient has                            taken no previous anticoagulant or antiplatelet  agents. ASA Grade Assessment: III - A patient with                            severe systemic disease. After reviewing the risks                            and benefits, the patient was deemed in                            satisfactory condition to undergo the procedure.                           After obtaining informed consent, the colonoscope                            was passed under direct vision. Throughout the                             procedure, the patient's blood pressure, pulse, and                            oxygen saturations were monitored continuously. The                            CF [id number] #[id number] was introduced through the anus                            and advanced to the 3 cm into the ileum. A second                            forward view of the right colon was performed. The                            colonoscopy was performed with moderate difficulty                            due to the patient's oxygen desaturation. The                            patient tolerated the procedure. The quality of the                            bowel preparation was good. 1079m of liquid was                            aspirated during the procedure. The terminal ileum,                            ileocecal valve, appendiceal orifice, and rectum                            were photographed. Scope In: 8:36:54 AM Scope Out: 88:25:05AM Scope Withdrawal Time: 0 hours 16  minutes 46 seconds  Total Procedure Duration: 0 hours 19 minutes 19 seconds  Findings:                 The perianal and digital rectal examinations were                            normal.                           Non-bleeding internal hemorrhoids were found.                           A 3 mm polyp was found in the transverse colon. The                            polyp was sessile. The polyp was removed with a                            cold snare. Resection and retrieval were complete.                            Estimated blood loss was minimal.                           A 3 mm polyp was found in the ascending colon. The                            polyp was sessile. The polyp was removed with a                            cold snare. Resection and retrieval were complete.                            Estimated blood loss was minimal.                           The exam was otherwise without abnormality on                            direct and  retroflexion views. Complications:            Aspiration. He vomited approximately 1078m of                            liquid/food at the time of cecal intubation. He                            coughed through the remainder of the procedure. Estimated Blood Loss:     Estimated blood loss was minimal. Estimated blood                            loss: none. Impression:               - Non-bleeding internal hemorrhoids.                           -  One 3 mm polyp in the transverse colon, removed                            with a cold snare. Resected and retrieved.                           - One 3 mm polyp in the ascending colon, removed                            with a cold snare. Resected and retrieved.                           - Concerns for aspiration.                           - The examination was otherwise normal on direct                            and retroflexion views. Recommendation:           - Patient has a contact number available for                            emergencies. The signs and symptoms of potential                            delayed complications were discussed with the                            patient. Return to normal activities tomorrow.                            Written discharge instructions were provided to the                            patient.                           - Resume previous diet.                           - Continue present medications.                           - Augmentin 875 mg BID x 10 days.                           - Await pathology results.                           - Consider outpatient evaluation for gastroparesis.                           - Repeat colonoscopy date to be determined after  pending pathology results are reviewed for                            surveillance.                           - Emerging evidence supports eating a diet of                            fruits, vegetables, grains, calcium,  and yogurt                            while reducing red meat and alcohol may reduce the                            risk of colon cancer.                           - Thank you for allowing me to be involved in your                            colon cancer prevention. Thornton Park MD, MD 02/22/2022 9:05:49 AM This report has been signed electronically.

## 2022-02-23 ENCOUNTER — Telehealth: Payer: Self-pay

## 2022-02-23 NOTE — Telephone Encounter (Signed)
  Follow up Call-     02/22/2022    7:33 AM  Call back number  Post procedure Call Back phone  # [phone]  Permission to leave phone message Yes     Patient questions:  Do you have a fever, pain , or abdominal swelling? No. Pain Score  0 *  Have you tolerated food without any problems? Yes.    Have you been able to return to your normal activities? Yes.    Do you have any questions about your discharge instructions: Diet   No. Medications  No. Follow up visit  No.  Do you have questions or concerns about your Care? No.  Actions: * If pain score is 4 or above: No action needed, pain <4.

## 2022-02-25 ENCOUNTER — Other Ambulatory Visit (HOSPITAL_BASED_OUTPATIENT_CLINIC_OR_DEPARTMENT_OTHER): Payer: Self-pay

## 2022-02-25 ENCOUNTER — Encounter: Payer: Self-pay | Admitting: Gastroenterology

## 2022-02-25 DIAGNOSIS — R0681 Apnea, not elsewhere classified: Secondary | ICD-10-CM

## 2022-02-25 DIAGNOSIS — G4710 Hypersomnia, unspecified: Secondary | ICD-10-CM

## 2022-02-25 DIAGNOSIS — R5383 Other fatigue: Secondary | ICD-10-CM

## 2022-02-25 DIAGNOSIS — R0683 Snoring: Secondary | ICD-10-CM

## 2022-03-02 ENCOUNTER — Ambulatory Visit: Payer: 59 | Admitting: "Endocrinology

## 2022-03-07 ENCOUNTER — Other Ambulatory Visit (HOSPITAL_COMMUNITY): Payer: Self-pay | Admitting: Family Medicine

## 2022-03-07 ENCOUNTER — Other Ambulatory Visit: Payer: Self-pay | Admitting: Family Medicine

## 2022-03-07 DIAGNOSIS — R911 Solitary pulmonary nodule: Secondary | ICD-10-CM

## 2022-03-09 ENCOUNTER — Ambulatory Visit: Payer: 59 | Admitting: Nurse Practitioner

## 2022-03-29 ENCOUNTER — Encounter (HOSPITAL_COMMUNITY): Payer: Self-pay

## 2022-03-29 ENCOUNTER — Ambulatory Visit (HOSPITAL_COMMUNITY): Admission: RE | Admit: 2022-03-29 | Payer: 59 | Source: Ambulatory Visit

## 2022-04-06 ENCOUNTER — Encounter: Payer: 59 | Admitting: Nurse Practitioner

## 2022-04-06 NOTE — Progress Notes (Signed)
This encounter was created in error - please disregard.

## 2022-04-19 ENCOUNTER — Emergency Department (HOSPITAL_COMMUNITY)
Admission: EM | Admit: 2022-04-19 | Discharge: 2022-04-20 | Disposition: A | Discharge status: Home / Self Care | Payer: 59 | Source: Home / Self Care

## 2022-04-19 ENCOUNTER — Emergency Department (HOSPITAL_COMMUNITY): Payer: 59

## 2022-04-19 ENCOUNTER — Other Ambulatory Visit: Payer: Self-pay

## 2022-04-19 DIAGNOSIS — M542 Cervicalgia: Secondary | ICD-10-CM | POA: Insufficient documentation

## 2022-04-19 DIAGNOSIS — E871 Hypo-osmolality and hyponatremia: Secondary | ICD-10-CM | POA: Diagnosis not present

## 2022-04-19 DIAGNOSIS — R4182 Altered mental status, unspecified: Secondary | ICD-10-CM | POA: Diagnosis present

## 2022-04-19 DIAGNOSIS — R519 Headache, unspecified: Secondary | ICD-10-CM | POA: Insufficient documentation

## 2022-04-19 DIAGNOSIS — Y92410 Unspecified street and highway as the place of occurrence of the external cause: Secondary | ICD-10-CM | POA: Insufficient documentation

## 2022-04-19 DIAGNOSIS — N1832 Chronic kidney disease, stage 3b: Secondary | ICD-10-CM | POA: Diagnosis not present

## 2022-04-19 DIAGNOSIS — R55 Syncope and collapse: Secondary | ICD-10-CM | POA: Insufficient documentation

## 2022-04-19 DIAGNOSIS — I129 Hypertensive chronic kidney disease with stage 1 through stage 4 chronic kidney disease, or unspecified chronic kidney disease: Secondary | ICD-10-CM | POA: Diagnosis not present

## 2022-04-19 DIAGNOSIS — Z5321 Procedure and treatment not carried out due to patient leaving prior to being seen by health care provider: Secondary | ICD-10-CM | POA: Insufficient documentation

## 2022-04-19 DIAGNOSIS — H538 Other visual disturbances: Secondary | ICD-10-CM | POA: Insufficient documentation

## 2022-04-19 DIAGNOSIS — Z79899 Other long term (current) drug therapy: Secondary | ICD-10-CM | POA: Diagnosis not present

## 2022-04-19 DIAGNOSIS — E782 Mixed hyperlipidemia: Secondary | ICD-10-CM | POA: Diagnosis not present

## 2022-04-19 DIAGNOSIS — Z794 Long term (current) use of insulin: Secondary | ICD-10-CM | POA: Diagnosis not present

## 2022-04-19 DIAGNOSIS — E1159 Type 2 diabetes mellitus with other circulatory complications: Secondary | ICD-10-CM | POA: Diagnosis not present

## 2022-04-19 DIAGNOSIS — E1100 Type 2 diabetes mellitus with hyperosmolarity without nonketotic hyperglycemic-hyperosmolar coma (NKHHC): Secondary | ICD-10-CM | POA: Diagnosis not present

## 2022-04-19 DIAGNOSIS — R739 Hyperglycemia, unspecified: Secondary | ICD-10-CM | POA: Diagnosis not present
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
## 2022-04-20 ENCOUNTER — Other Ambulatory Visit: Payer: Self-pay

## 2022-04-20 ENCOUNTER — Encounter (HOSPITAL_COMMUNITY): Payer: Self-pay | Admitting: Emergency Medicine

## 2022-04-20 ENCOUNTER — Observation Stay (HOSPITAL_COMMUNITY)
Admission: EM | Admit: 2022-04-20 | Discharge: 2022-04-22 | Disposition: A | Discharge status: Home / Self Care | Payer: 59 | Attending: Internal Medicine | Admitting: Internal Medicine

## 2022-04-20 DIAGNOSIS — R739 Hyperglycemia, unspecified: Secondary | ICD-10-CM

## 2022-04-20 DIAGNOSIS — I10 Essential (primary) hypertension: Secondary | ICD-10-CM | POA: Diagnosis present

## 2022-04-20 DIAGNOSIS — E876 Hypokalemia: Secondary | ICD-10-CM | POA: Diagnosis present

## 2022-04-20 DIAGNOSIS — Z8673 Personal history of transient ischemic attack (TIA), and cerebral infarction without residual deficits: Secondary | ICD-10-CM

## 2022-04-20 DIAGNOSIS — E860 Dehydration: Secondary | ICD-10-CM | POA: Diagnosis present

## 2022-04-20 DIAGNOSIS — N1832 Chronic kidney disease, stage 3b: Secondary | ICD-10-CM | POA: Insufficient documentation

## 2022-04-20 DIAGNOSIS — N179 Acute kidney failure, unspecified: Secondary | ICD-10-CM | POA: Diagnosis present

## 2022-04-20 DIAGNOSIS — Z885 Allergy status to narcotic agent status: Secondary | ICD-10-CM

## 2022-04-20 DIAGNOSIS — E782 Mixed hyperlipidemia: Secondary | ICD-10-CM | POA: Insufficient documentation

## 2022-04-20 DIAGNOSIS — Z79899 Other long term (current) drug therapy: Secondary | ICD-10-CM | POA: Insufficient documentation

## 2022-04-20 DIAGNOSIS — I129 Hypertensive chronic kidney disease with stage 1 through stage 4 chronic kidney disease, or unspecified chronic kidney disease: Secondary | ICD-10-CM | POA: Insufficient documentation

## 2022-04-20 DIAGNOSIS — Z9852 Vasectomy status: Secondary | ICD-10-CM

## 2022-04-20 DIAGNOSIS — Z7984 Long term (current) use of oral hypoglycemic drugs: Secondary | ICD-10-CM

## 2022-04-20 DIAGNOSIS — E1100 Type 2 diabetes mellitus with hyperosmolarity without nonketotic hyperglycemic-hyperosmolar coma (NKHHC): Principal | ICD-10-CM | POA: Insufficient documentation

## 2022-04-20 DIAGNOSIS — Y92410 Unspecified street and highway as the place of occurrence of the external cause: Secondary | ICD-10-CM

## 2022-04-20 DIAGNOSIS — Z794 Long term (current) use of insulin: Secondary | ICD-10-CM | POA: Insufficient documentation

## 2022-04-20 DIAGNOSIS — E1159 Type 2 diabetes mellitus with other circulatory complications: Secondary | ICD-10-CM | POA: Insufficient documentation

## 2022-04-20 DIAGNOSIS — E1122 Type 2 diabetes mellitus with diabetic chronic kidney disease: Secondary | ICD-10-CM | POA: Diagnosis present

## 2022-04-20 DIAGNOSIS — E871 Hypo-osmolality and hyponatremia: Secondary | ICD-10-CM | POA: Insufficient documentation

## 2022-04-20 LAB — COMPREHENSIVE METABOLIC PANEL
ALT: 21 U/L (ref 0–44)
AST: 21 U/L (ref 15–41)
Albumin: 4.6 g/dL (ref 3.5–5.0)
Alkaline Phosphatase: 100 U/L (ref 38–126)
Anion gap: 13 (ref 5–15)
BUN: 40 mg/dL — ABNORMAL HIGH (ref 6–20)
CO2: 25 mmol/L (ref 22–32)
Calcium: 9.5 mg/dL (ref 8.9–10.3)
Chloride: 85 mmol/L — ABNORMAL LOW (ref 98–111)
Creatinine, Ser: 2.64 mg/dL — ABNORMAL HIGH (ref 0.61–1.24)
GFR, Estimated: 27 mL/min — ABNORMAL LOW (ref >60)
Glucose, Bld: 761 mg/dL (ref 70–99)
Potassium: 3.7 mmol/L (ref 3.5–5.1)
Sodium: 123 mmol/L — ABNORMAL LOW (ref 135–145)
Total Bilirubin: 1.2 mg/dL (ref 0.3–1.2)
Total Protein: 9.1 g/dL — ABNORMAL HIGH (ref 6.5–8.1)

## 2022-04-20 LAB — CBC WITH DIFFERENTIAL/PLATELET
Abs Immature Granulocytes: 0.01 10*3/uL (ref 0.00–0.07)
Basophils Absolute: 0 10*3/uL (ref 0.0–0.1)
Basophils Relative: 1 %
Eosinophils Absolute: 0.1 10*3/uL (ref 0.0–0.5)
Eosinophils Relative: 2 %
HCT: 46.3 % (ref 39.0–52.0)
Hemoglobin: 16.3 g/dL (ref 13.0–17.0)
Immature Granulocytes: 0 %
Lymphocytes Relative: 27 %
Lymphs Abs: 1.2 10*3/uL (ref 0.7–4.0)
MCH: 29.2 pg (ref 26.0–34.0)
MCHC: 35.2 g/dL (ref 30.0–36.0)
MCV: 83 fL (ref 80.0–100.0)
Monocytes Absolute: 0.3 10*3/uL (ref 0.1–1.0)
Monocytes Relative: 8 %
Neutro Abs: 2.7 10*3/uL (ref 1.7–7.7)
Neutrophils Relative %: 62 %
Platelets: 278 10*3/uL (ref 150–400)
RBC: 5.58 MIL/uL (ref 4.22–5.81)
RDW: 12.5 % (ref 11.5–15.5)
WBC: 4.4 10*3/uL (ref 4.0–10.5)
nRBC: 0 % (ref 0.0–0.2)

## 2022-04-20 LAB — CBG MONITORING, ED
Glucose-Capillary: >600 mg/dL (ref 70–99)
Glucose-Capillary: 517 mg/dL (ref 70–99)
Glucose-Capillary: 321 mg/dL — ABNORMAL HIGH (ref 70–99)
Glucose-Capillary: 160 mg/dL — ABNORMAL HIGH (ref 70–99)
Glucose-Capillary: 127 mg/dL — ABNORMAL HIGH (ref 70–99)

## 2022-04-20 LAB — URINALYSIS, ROUTINE W REFLEX MICROSCOPIC
Bacteria, UA: NONE SEEN
Bilirubin Urine: NEGATIVE
Glucose, UA: >=500 mg/dL — AB
Hgb urine dipstick: NEGATIVE
Ketones, ur: NEGATIVE mg/dL
Leukocytes,Ua: NEGATIVE
Nitrite: NEGATIVE
Protein, ur: NEGATIVE mg/dL
Specific Gravity, Urine: 1.025 (ref 1.005–1.030)
pH: 5 (ref 5.0–8.0)

## 2022-04-20 LAB — GLUCOSE, CAPILLARY
Glucose-Capillary: 98 mg/dL (ref 70–99)
Glucose-Capillary: 101 mg/dL — ABNORMAL HIGH (ref 70–99)
Glucose-Capillary: 95 mg/dL (ref 70–99)
Glucose-Capillary: 264 mg/dL — ABNORMAL HIGH (ref 70–99)

## 2022-04-20 LAB — BASIC METABOLIC PANEL
Anion gap: 8 (ref 5–15)
Anion gap: 7 (ref 5–15)
Anion gap: 6 (ref 5–15)
BUN: 30 mg/dL — ABNORMAL HIGH (ref 6–20)
BUN: 29 mg/dL — ABNORMAL HIGH (ref 6–20)
BUN: 29 mg/dL — ABNORMAL HIGH (ref 6–20)
CO2: 28 mmol/L (ref 22–32)
CO2: 27 mmol/L (ref 22–32)
CO2: 26 mmol/L (ref 22–32)
Calcium: 9 mg/dL (ref 8.9–10.3)
Calcium: 8.6 mg/dL — ABNORMAL LOW (ref 8.9–10.3)
Calcium: 8.4 mg/dL — ABNORMAL LOW (ref 8.9–10.3)
Chloride: 101 mmol/L (ref 98–111)
Chloride: 101 mmol/L (ref 98–111)
Chloride: 100 mmol/L (ref 98–111)
Creatinine, Ser: 1.99 mg/dL — ABNORMAL HIGH (ref 0.61–1.24)
Creatinine, Ser: 2.03 mg/dL — ABNORMAL HIGH (ref 0.61–1.24)
Creatinine, Ser: 2.14 mg/dL — ABNORMAL HIGH (ref 0.61–1.24)
GFR, Estimated: 38 mL/min — ABNORMAL LOW (ref >60)
GFR, Estimated: 37 mL/min — ABNORMAL LOW (ref >60)
GFR, Estimated: 35 mL/min — ABNORMAL LOW (ref >60)
Glucose, Bld: 88 mg/dL (ref 70–99)
Glucose, Bld: 147 mg/dL — ABNORMAL HIGH (ref 70–99)
Glucose, Bld: 346 mg/dL — ABNORMAL HIGH (ref 70–99)
Potassium: 3.5 mmol/L (ref 3.5–5.1)
Potassium: 3.3 mmol/L — ABNORMAL LOW (ref 3.5–5.1)
Potassium: 3.7 mmol/L (ref 3.5–5.1)
Sodium: 137 mmol/L (ref 135–145)
Sodium: 135 mmol/L (ref 135–145)
Sodium: 132 mmol/L — ABNORMAL LOW (ref 135–145)

## 2022-04-20 LAB — VITAMIN B12: Vitamin B-12: 572 pg/mL (ref 180–914)

## 2022-04-20 LAB — TSH: TSH: 1.942 u[IU]/mL (ref 0.350–4.500)

## 2022-04-20 LAB — HEMOGLOBIN A1C
Hgb A1c MFr Bld: 12.4 % — ABNORMAL HIGH (ref 4.8–5.6)
Mean Plasma Glucose: 309.18 mg/dL

## 2022-04-20 LAB — BLOOD GAS, VENOUS
Acid-Base Excess: 3.5 mmol/L — ABNORMAL HIGH (ref 0.0–2.0)
Bicarbonate: 30.9 mmol/L — ABNORMAL HIGH (ref 20.0–28.0)
Drawn by: 62097
O2 Saturation: 46.7 %
Patient temperature: 36.9
pCO2, Ven: 56 mmHg (ref 44–60)
pH, Ven: 7.35 (ref 7.25–7.43)
pO2, Ven: <31 mmHg — CL (ref 32–45)

## 2022-04-20 LAB — HIV ANTIBODY (ROUTINE TESTING W REFLEX): HIV Screen 4th Generation wRfx: NONREACTIVE

## 2022-04-20 LAB — OSMOLALITY: Osmolality: 313 mOsm/kg — ABNORMAL HIGH (ref 275–295)

## 2022-04-20 LAB — MRSA NEXT GEN BY PCR, NASAL: MRSA by PCR Next Gen: NOT DETECTED

## 2022-04-20 MED ORDER — PANTOPRAZOLE SODIUM 40 MG PO TBEC
40.0000 mg | DELAYED_RELEASE_TABLET | Freq: Every day | ORAL | Status: DC
  Administered 2022-04-20 – 2022-04-22 (×3): 40 mg via ORAL
  Filled 2022-04-20 (×3): qty 1

## 2022-04-20 MED ORDER — DEXTROSE 50 % IV SOLN: 0.0000 mL | INTRAVENOUS | Status: DC | PRN

## 2022-04-20 MED ORDER — DEXTROSE IN LACTATED RINGERS 5 % IV SOLN: INTRAVENOUS | Status: DC

## 2022-04-20 MED ORDER — HEPARIN SODIUM (PORCINE) 5000 UNIT/ML IJ SOLN
5000.0000 [IU] | Freq: Three times a day (TID) | INTRAMUSCULAR | Status: DC
  Administered 2022-04-20 – 2022-04-22 (×5): 5000 [IU] via SUBCUTANEOUS
  Filled 2022-04-20 (×5): qty 1

## 2022-04-20 MED ORDER — ROSUVASTATIN CALCIUM 20 MG PO TABS
40.0000 mg | ORAL_TABLET | Freq: Every day | ORAL | Status: DC
  Administered 2022-04-20 – 2022-04-22 (×3): 40 mg via ORAL
  Filled 2022-04-20 (×3): qty 2

## 2022-04-20 MED ORDER — CHLORHEXIDINE GLUCONATE CLOTH 2 % EX PADS
6.0000 | MEDICATED_PAD | Freq: Every day | CUTANEOUS | Status: DC
  Administered 2022-04-21: 6 via TOPICAL

## 2022-04-20 MED ORDER — INSULIN REGULAR(HUMAN) IN NACL 100-0.9 UT/100ML-% IV SOLN
INTRAVENOUS | Status: DC
  Administered 2022-04-20: 11.5 [IU]/h via INTRAVENOUS
  Filled 2022-04-20: qty 100

## 2022-04-20 MED ORDER — METOPROLOL SUCCINATE ER 50 MG PO TB24
100.0000 mg | ORAL_TABLET | Freq: Every day | ORAL | Status: DC
  Administered 2022-04-21 – 2022-04-22 (×2): 100 mg via ORAL
  Filled 2022-04-20 (×3): qty 2

## 2022-04-20 MED ORDER — EZETIMIBE 10 MG PO TABS
10.0000 mg | ORAL_TABLET | Freq: Every day | ORAL | Status: DC
  Administered 2022-04-20 – 2022-04-22 (×3): 10 mg via ORAL
  Filled 2022-04-20 (×3): qty 1

## 2022-04-20 MED ORDER — AMLODIPINE BESYLATE 5 MG PO TABS
10.0000 mg | ORAL_TABLET | Freq: Every day | ORAL | Status: DC
  Administered 2022-04-20 – 2022-04-22 (×3): 10 mg via ORAL
  Filled 2022-04-20 (×3): qty 2

## 2022-04-20 MED ORDER — INSULIN ASPART 100 UNIT/ML IJ SOLN
0.0000 [IU] | Freq: Three times a day (TID) | INTRAMUSCULAR | Status: DC
  Administered 2022-04-21 (×2): 8 [IU] via SUBCUTANEOUS
  Administered 2022-04-21: 11 [IU] via SUBCUTANEOUS
  Administered 2022-04-22: 3 [IU] via SUBCUTANEOUS

## 2022-04-20 MED ORDER — POTASSIUM CHLORIDE 10 MEQ/100ML IV SOLN
10.0000 meq | INTRAVENOUS | Status: AC
  Administered 2022-04-20 (×2): 10 meq via INTRAVENOUS
  Filled 2022-04-20: qty 100

## 2022-04-20 MED ORDER — INSULIN REGULAR(HUMAN) IN NACL 100-0.9 UT/100ML-% IV SOLN: INTRAVENOUS | Status: DC

## 2022-04-20 MED ORDER — LACTATED RINGERS IV BOLUS
1850.0000 mL | Freq: Once | INTRAVENOUS | Status: AC
  Administered 2022-04-20: 1850 mL via INTRAVENOUS

## 2022-04-20 MED ORDER — POTASSIUM CHLORIDE 10 MEQ/100ML IV SOLN: 10.0000 meq | INTRAVENOUS | Status: DC

## 2022-04-20 MED ORDER — INSULIN ASPART 100 UNIT/ML IJ SOLN
0.0000 [IU] | Freq: Every day | INTRAMUSCULAR | Status: DC
  Administered 2022-04-20 – 2022-04-21 (×2): 3 [IU] via SUBCUTANEOUS

## 2022-04-20 MED ORDER — LACTATED RINGERS IV SOLN: INTRAVENOUS | Status: DC

## 2022-04-20 MED ORDER — SODIUM CHLORIDE 0.9 % IV BOLUS
2000.0000 mL | Freq: Once | INTRAVENOUS | Status: AC
  Administered 2022-04-20: 2000 mL via INTRAVENOUS

## 2022-04-20 MED ORDER — INSULIN DETEMIR 100 UNIT/ML ~~LOC~~ SOLN
12.0000 [IU] | Freq: Two times a day (BID) | SUBCUTANEOUS | Status: DC
  Administered 2022-04-20: 12 [IU] via SUBCUTANEOUS
  Filled 2022-04-20 (×4): qty 0.12

## 2022-04-20 MED ORDER — TAMSULOSIN HCL 0.4 MG PO CAPS
0.4000 mg | ORAL_CAPSULE | Freq: Every day | ORAL | Status: DC
  Administered 2022-04-20 – 2022-04-21 (×2): 0.4 mg via ORAL
  Filled 2022-04-20 (×2): qty 1
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
## 2022-04-20 NOTE — Assessment & Plan Note (Addendum)
-  In the setting of prerenal azotemia from dehydration and continued use of nephrotoxic agents. -Continue to hold ARB/HCTZ at discharge until follow up with PCP. -Patient has been advised to maintain adequate hydration -Continue to follow follow renal function trend. -Creatinine 1.89 at discharge

## 2022-04-20 NOTE — Assessment & Plan Note (Addendum)
-  Patient blood sugar over 700 at time of admission -After fluid resuscitation and insulin drip; hyperosmolar state corrected. -Modified carbohydrate diet discussed with patient and encouraged for compliance provided -At discharge patient will use Levemir twice a day, resumption of weekly Trulicity and NovoLog for meal coverage 3 times daily. -Given his renal failure metformin and Jardiance has been discontinued at discharge. -Continue close monitoring of patient's CBGs/A1c and further adjust hypoglycemic regimen as required.    -A1c 12.4 (demonstrating poorly controlled diabetes).
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
## 2022-04-20 NOTE — ED Notes (Signed)
Patient alert & oriented X3, however patient makes comments that do not make sense or is out of context and bizarre.
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
## 2022-04-20 NOTE — ED Notes (Signed)
Pt called for room multiple times. This RN advised that pt seen leaving department

## 2022-04-20 NOTE — Assessment & Plan Note (Addendum)
-  Presented with hyperosmolar hyperglycemic state -A1c 12.4. -Continue to follow CBGs and adjust hypoglycemic regimen as needed -Given chronic renal failure will not recommend continuation of metformin and Jardiance at time of discharge. -Continue modified carbohydrate diet. -Continue Levemir twice a day, weekly Trulicity injection and 3 times daily NovoLog for meal coverage.

## 2022-04-20 NOTE — Assessment & Plan Note (Addendum)
-  Continue home antihypertensive agents except for hydrochlorothiazide and losartan. -Heart healthy diet discussed with patient -Given the still elevated blood pressure patient discharged on Imdur. -Continue to adjust antihypertensive agents as needed.

## 2022-04-20 NOTE — Assessment & Plan Note (Signed)
-  Pseudohyponatremia in the setting of hyperglycemia -Sodium level resolved after CBGs stabilization achieved. -Patient advised to maintain adequate hydration.

## 2022-04-20 NOTE — Assessment & Plan Note (Addendum)
-  Old strokes appreciated on patient's CT scan of the head -No acute intracranial abnormalities seen. -Continue risk factors modification -Patient has been discharge baby aspirin for secondary prevention. -Oriented x3 and without focal deficits appreciated during examination.

## 2022-04-20 NOTE — Assessment & Plan Note (Signed)
-  Continues to obtain -Heart healthy diet discussed with patient.

## 2022-04-21 DIAGNOSIS — R739 Hyperglycemia, unspecified: Secondary | ICD-10-CM

## 2022-04-21 DIAGNOSIS — E782 Mixed hyperlipidemia: Secondary | ICD-10-CM

## 2022-04-21 DIAGNOSIS — N179 Acute kidney failure, unspecified: Secondary | ICD-10-CM | POA: Diagnosis not present

## 2022-04-21 DIAGNOSIS — E1100 Type 2 diabetes mellitus with hyperosmolarity without nonketotic hyperglycemic-hyperosmolar coma (NKHHC): Secondary | ICD-10-CM | POA: Diagnosis not present

## 2022-04-21 DIAGNOSIS — I10 Essential (primary) hypertension: Secondary | ICD-10-CM

## 2022-04-21 DIAGNOSIS — E876 Hypokalemia: Secondary | ICD-10-CM

## 2022-04-21 LAB — BASIC METABOLIC PANEL WITH GFR
Anion gap: 6 (ref 5–15)
BUN: 28 mg/dL — ABNORMAL HIGH (ref 6–20)
CO2: 26 mmol/L (ref 22–32)
Calcium: 8.7 mg/dL — ABNORMAL LOW (ref 8.9–10.3)
Chloride: 102 mmol/L (ref 98–111)
Creatinine, Ser: 2.07 mg/dL — ABNORMAL HIGH (ref 0.61–1.24)
GFR, Estimated: 36 mL/min — ABNORMAL LOW
Glucose, Bld: 267 mg/dL — ABNORMAL HIGH (ref 70–99)
Potassium: 3.4 mmol/L — ABNORMAL LOW (ref 3.5–5.1)
Sodium: 134 mmol/L — ABNORMAL LOW (ref 135–145)

## 2022-04-21 LAB — GLUCOSE, CAPILLARY
Glucose-Capillary: 309 mg/dL — ABNORMAL HIGH (ref 70–99)
Glucose-Capillary: 280 mg/dL — ABNORMAL HIGH (ref 70–99)
Glucose-Capillary: 260 mg/dL — ABNORMAL HIGH (ref 70–99)
Glucose-Capillary: 279 mg/dL — ABNORMAL HIGH (ref 70–99)

## 2022-04-21 MED ORDER — POTASSIUM CHLORIDE CRYS ER 20 MEQ PO TBCR
40.0000 meq | EXTENDED_RELEASE_TABLET | Freq: Once | ORAL | Status: AC
  Administered 2022-04-21: 40 meq via ORAL
  Filled 2022-04-21: qty 2

## 2022-04-21 MED ORDER — ISOSORBIDE MONONITRATE ER 60 MG PO TB24
30.0000 mg | ORAL_TABLET | Freq: Every day | ORAL | Status: DC
  Administered 2022-04-21 – 2022-04-22 (×2): 30 mg via ORAL
  Filled 2022-04-21 (×2): qty 1

## 2022-04-21 MED ORDER — INSULIN DETEMIR 100 UNIT/ML ~~LOC~~ SOLN
15.0000 [IU] | Freq: Two times a day (BID) | SUBCUTANEOUS | Status: DC
  Administered 2022-04-21 – 2022-04-22 (×3): 15 [IU] via SUBCUTANEOUS
  Filled 2022-04-21 (×5): qty 0.15

## 2022-04-21 MED ORDER — INSULIN ASPART 100 UNIT/ML IJ SOLN
3.0000 [IU] | Freq: Three times a day (TID) | INTRAMUSCULAR | Status: DC
  Administered 2022-04-21 – 2022-04-22 (×2): 3 [IU] via SUBCUTANEOUS

## 2022-04-21 NOTE — Discharge Instructions (Signed)
Local Endocrinologists [facility] [phone]) Dr. Philemon Kingdom Dr. Jan[facility] [phone]) Dr. Delrae Rend [facility] [phone]) Dr. Jacelyn Pi Dr. Anda Kraft [facility] [phone]) Dr. Daneil Dolin [facility] [phone]) [place] office]  [phone]) Shari Prows office] Dr. Lavone Orn Dr. Mee Hives [facility]) [phone]) Autumn Barbaraann Barthel Ronnald Ramp), PA Dr. Amalia Greenhouse Dr. Marsh Dolly. [facility] [phone]) Dr. Glade Lloyd [facility] [phone]) Dr. Orville Govern Dr. Lelon Huh Dr. Lavone Nian, FNP Dr. Carolynn Serve. Doerr in [place] [phone])
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
## 2022-04-21 NOTE — TOC Progression Note (Signed)
  Transition of Care (TOC) Screening Note   Patient Details  Name: [name] [name] Date of Birth: Mar 18, 1961   Transition of Care [facility]) CM/SW Contact:    Shade Flood, LCSW Phone Number: 04/21/2022, 11:40 AM    Transition of Care Department [facility]) has reviewed patient and no TOC needs have been identified at this time. We will continue to monitor patient advancement through interdisciplinary progression rounds. If new patient transition needs arise, please place a TOC consult.

## 2022-04-21 NOTE — Inpatient Diabetes Management (Signed)
Inpatient Diabetes Program Recommendations  AACE/ADA: New Consensus Statement on Inpatient Glycemic Control (2015)  Target Ranges:  Prepandial:   less than 140 mg/dL      Peak postprandial:   less than 180 mg/dL (1-2 hours)      Critically ill patients:  140 - 180 mg/dL   Lab Results  Component Value Date   GLUCAP 280 (H) 04/21/2022   HGBA1C 12.4 (H) 04/20/2022    Review of Glycemic Control  Latest Reference Range & Units 04/20/22 21:21 04/21/22 07:32 04/21/22 11:37  Glucose-Capillary 70 - 99 mg/dL 264 (H) 309 (H) 280 (H)  (H): Data is abnormally high Diabetes history: Type 2 DM Outpatient Diabetes medications: Trulicity 3 ml qwk, Metformin 500 mg BID, Jardiance 25 mg QD, Humalog 75/25 60-65 units BID Current orders for Inpatient glycemic control: Levemir 15 units BID, Novolog 3 units TID, Novolog 0-15 units TID & HS  Inpatient Diabetes Program Recommendations:    Consider increasing Levemir to 24 units BID.   Spoke with patient by phone regarding outpatient diabetes management. Patient denies having hypoglycemia, missing doses and verified home doses. Has been previously followed by Dr Dorris Fetch, with last appointment in 04/2021.  Reviewed patient's current A1c of 12.4%. Explained what a A1c is and what it measures. Also reviewed goal A1c with patient, importance of good glucose control @ home, and blood sugar goals. Reviewed patho of DM, need for insulin, need for improved control, survival skills, interventions, vascular changes and commorbidities.  Patient is requesting a Dexcom at discharge [phone]. Reports having one previously. Is not interested in following back with Dr Dorris Fetch, will attach additional information on endocrinology on discharge summary.  Admits to drinking large amounts of sugary beverages like vitamin water. Reviewed alternatives, need for changes, plate method and importance of continued CHo mindfulness.  No further questions and plans to follow up with PCP. Discussed  with patient that Dexcom may require PA, so he will need to follow up.   Thanks, [name] Curb, MSN, RNC-OB Diabetes Coordinator [phone] (8a-5p)

## 2022-04-21 NOTE — Progress Notes (Signed)
Patient walked up to 3rd with his ICCU nurse ready to leave AMA, MD Dc'ed tele he took a shower and is resting in bed.

## 2022-04-21 NOTE — Assessment & Plan Note (Addendum)
-  Continue to follow electrolytes and further replete as needed.

## 2022-04-22 DIAGNOSIS — I10 Essential (primary) hypertension: Secondary | ICD-10-CM | POA: Diagnosis not present

## 2022-04-22 DIAGNOSIS — E782 Mixed hyperlipidemia: Secondary | ICD-10-CM | POA: Diagnosis present

## 2022-04-22 DIAGNOSIS — E1159 Type 2 diabetes mellitus with other circulatory complications: Secondary | ICD-10-CM | POA: Diagnosis present

## 2022-04-22 DIAGNOSIS — E876 Hypokalemia: Secondary | ICD-10-CM | POA: Diagnosis not present

## 2022-04-22 DIAGNOSIS — N1832 Chronic kidney disease, stage 3b: Secondary | ICD-10-CM | POA: Diagnosis present

## 2022-04-22 DIAGNOSIS — Z8673 Personal history of transient ischemic attack (TIA), and cerebral infarction without residual deficits: Secondary | ICD-10-CM | POA: Diagnosis not present

## 2022-04-22 DIAGNOSIS — E871 Hypo-osmolality and hyponatremia: Secondary | ICD-10-CM | POA: Diagnosis present

## 2022-04-22 DIAGNOSIS — E1122 Type 2 diabetes mellitus with diabetic chronic kidney disease: Secondary | ICD-10-CM | POA: Diagnosis present

## 2022-04-22 DIAGNOSIS — N179 Acute kidney failure, unspecified: Secondary | ICD-10-CM | POA: Diagnosis not present

## 2022-04-22 DIAGNOSIS — Z885 Allergy status to narcotic agent status: Secondary | ICD-10-CM | POA: Diagnosis not present

## 2022-04-22 DIAGNOSIS — Y92410 Unspecified street and highway as the place of occurrence of the external cause: Secondary | ICD-10-CM | POA: Diagnosis not present

## 2022-04-22 DIAGNOSIS — E860 Dehydration: Secondary | ICD-10-CM | POA: Diagnosis present

## 2022-04-22 DIAGNOSIS — Z794 Long term (current) use of insulin: Secondary | ICD-10-CM | POA: Diagnosis not present

## 2022-04-22 DIAGNOSIS — I129 Hypertensive chronic kidney disease with stage 1 through stage 4 chronic kidney disease, or unspecified chronic kidney disease: Secondary | ICD-10-CM | POA: Diagnosis present

## 2022-04-22 DIAGNOSIS — Z79899 Other long term (current) drug therapy: Secondary | ICD-10-CM | POA: Diagnosis not present

## 2022-04-22 DIAGNOSIS — R739 Hyperglycemia, unspecified: Secondary | ICD-10-CM | POA: Diagnosis present

## 2022-04-22 DIAGNOSIS — Z7984 Long term (current) use of oral hypoglycemic drugs: Secondary | ICD-10-CM | POA: Diagnosis not present

## 2022-04-22 DIAGNOSIS — E1100 Type 2 diabetes mellitus with hyperosmolarity without nonketotic hyperglycemic-hyperosmolar coma (NKHHC): Secondary | ICD-10-CM | POA: Diagnosis not present

## 2022-04-22 DIAGNOSIS — Z9852 Vasectomy status: Secondary | ICD-10-CM | POA: Diagnosis not present

## 2022-04-22 LAB — GLUCOSE, CAPILLARY: Glucose-Capillary: 162 mg/dL — ABNORMAL HIGH (ref 70–99)

## 2022-04-22 LAB — BASIC METABOLIC PANEL
Anion gap: 7 (ref 5–15)
BUN: 26 mg/dL — ABNORMAL HIGH (ref 6–20)
CO2: 25 mmol/L (ref 22–32)
Calcium: 8.7 mg/dL — ABNORMAL LOW (ref 8.9–10.3)
Chloride: 106 mmol/L (ref 98–111)
Creatinine, Ser: 1.89 mg/dL — ABNORMAL HIGH (ref 0.61–1.24)
GFR, Estimated: 40 mL/min — ABNORMAL LOW (ref >60)
Glucose, Bld: 142 mg/dL — ABNORMAL HIGH (ref 70–99)
Potassium: 3.4 mmol/L — ABNORMAL LOW (ref 3.5–5.1)
Sodium: 138 mmol/L (ref 135–145)

## 2022-04-22 MED ORDER — CHLORTHALIDONE 25 MG PO TABS: 25.0000 mg | ORAL_TABLET | Freq: Every morning | ORAL | Status: DC

## 2022-04-22 MED ORDER — INSULIN DETEMIR 100 UNIT/ML FLEXPEN: 20.0000 [IU] | PEN_INJECTOR | Freq: Two times a day (BID) | SUBCUTANEOUS | 4 refills | Status: DC

## 2022-04-22 MED ORDER — ISOSORBIDE MONONITRATE ER 30 MG PO TB24: 30.0000 mg | ORAL_TABLET | Freq: Every day | ORAL | 2 refills | Status: DC

## 2022-04-22 MED ORDER — ASPIRIN 81 MG PO TBEC: 81.0000 mg | DELAYED_RELEASE_TABLET | Freq: Every day | ORAL | 11 refills | Status: AC

## 2022-04-22 MED ORDER — NOVOLOG FLEXPEN RELION 100 UNIT/ML ~~LOC~~ SOPN: 7.0000 [IU] | PEN_INJECTOR | Freq: Three times a day (TID) | SUBCUTANEOUS | 4 refills | Status: DC

## 2022-04-22 MED ORDER — DEXCOM G6 SENSOR MISC: 2 refills | Status: DC

## 2022-04-22 MED ORDER — IRBESARTAN 300 MG PO TABS: 300.0000 mg | ORAL_TABLET | Freq: Every day | ORAL | Status: DC
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
## 2022-04-22 NOTE — Progress Notes (Signed)
Patient informed this nurse at [place] that he is leaving in the morning at 1000.  Patient got a new IV, Informed patient that IV was needed for him to obtain his IV fluids. Lactated ringers restarted at [place]. No further complaint from Patient.
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
## 2022-04-25 ENCOUNTER — Ambulatory Visit (HOSPITAL_COMMUNITY): Admission: RE | Admit: 2022-04-25 | Payer: 59 | Source: Ambulatory Visit

## 2022-05-16 ENCOUNTER — Ambulatory Visit (HOSPITAL_COMMUNITY): Admission: RE | Admit: 2022-05-16 | Payer: 59 | Source: Ambulatory Visit

## 2022-05-16 ENCOUNTER — Encounter (HOSPITAL_COMMUNITY): Payer: Self-pay

## 2022-08-08 ENCOUNTER — Ambulatory Visit: Payer: 59 | Attending: Family Medicine | Admitting: Pulmonary Disease

## 2022-08-08 DIAGNOSIS — R5383 Other fatigue: Secondary | ICD-10-CM

## 2022-08-08 DIAGNOSIS — R0683 Snoring: Secondary | ICD-10-CM

## 2022-08-08 DIAGNOSIS — G4710 Hypersomnia, unspecified: Secondary | ICD-10-CM

## 2022-08-08 DIAGNOSIS — G4733 Obstructive sleep apnea (adult) (pediatric): Secondary | ICD-10-CM

## 2022-08-08 DIAGNOSIS — R0681 Apnea, not elsewhere classified: Secondary | ICD-10-CM | POA: Diagnosis present

## 2022-08-11 DIAGNOSIS — G4733 Obstructive sleep apnea (adult) (pediatric): Secondary | ICD-10-CM | POA: Diagnosis not present

## 2022-08-11 DIAGNOSIS — R0683 Snoring: Secondary | ICD-10-CM | POA: Diagnosis not present

## 2022-08-11 NOTE — Procedures (Signed)
Patient Name: [name], [name] Date: 08/08/2022 Gender: Male D.O.B: Jan 24, 1961 Age (years): 59 Referring Provider: Delynn Flavin P Mullis DO Height (inches): 71 Interpreting Physician: Kara Mead MD, ABSM Weight (lbs): 206 RPSGT: Rosebud Poles BMI: 29 MRN: [id number] Neck Size: 18.00 <br> <br> CLINICAL INFORMATION Sleep Study Type: NPSG    Indication for sleep study: snoring & fatigue    Epworth Sleepiness Score: 10    SLEEP STUDY TECHNIQUE As per the AASM Manual for the Scoring of Sleep and Associated Events v2.3 (April 2016) with a hypopnea requiring 4% desaturations.  The channels recorded and monitored were frontal, central and occipital EEG, electrooculogram (EOG), submentalis EMG (chin), nasal and oral airflow, thoracic and abdominal wall motion, anterior tibialis EMG, snore microphone, electrocardiogram, and pulse oximetry.  MEDICATIONS Medications self-administered by patient taken the night of the study : N/A  SLEEP ARCHITECTURE The study was initiated at 10:38:38 PM and ended at 5:22:33 AM.  Sleep onset time was 10.3 minutes and the sleep efficiency was 86.0%. The total sleep time was 347.5 minutes.  Stage REM latency was 53.5 minutes.  The patient spent 3.60% of the night in stage N1 sleep, 50.94% in stage N2 sleep, 25.32% in stage N3 and 20.1% in REM.  Alpha intrusion was absent.  Supine sleep was 29.72%.  RESPIRATORY PARAMETERS The overall apnea/hypopnea index (AHI) was 25.2 per hour. There were 38 total apneas, including 34 obstructive, 4 central and 0 mixed apneas. There were 108 hypopneas and 0 RERAs.  The AHI during Stage REM sleep was 6.0 per hour.  AHI while supine was 74.9 per hour.  The mean oxygen saturation was 94.47%. The minimum SpO2 during sleep was 78.00%.  loud snoring was noted during this study.  CARDIAC DATA The 2 lead EKG demonstrated sinus rhythm. The mean heart rate was 87.59 beats per minute. Other EKG findings include:  None.   LEG MOVEMENT DATA The total PLMS were 0 with a resulting PLMS index of 0.00. Associated arousal with leg movement index was 0.0 .  IMPRESSIONS - Moderate obstructive sleep apnea occurred during this study (AHI = 25.2/h). Events were mainly noted in the supine position - No significant central sleep apnea occurred during this study (CAI = 0.7/h). - Moderate oxygen desaturation was noted during this study (Min O2 = 78.00%). - The patient snored with loud snoring volume. - No cardiac abnormalities were noted during this study. - Clinically significant periodic limb movements did not occur during sleep. No significant associated arousals.   DIAGNOSIS - Obstructive Sleep Apnea (G47.33)   RECOMMENDATIONS - Therapeutic CPAP titration to determine optimal pressure required to alleviate sleep disordered breathing. - Positional therapy avoiding supine position during sleep may suffice but given his cardiovascular profile, would prefer to treat - Avoid alcohol, sedatives and other CNS depressants that may worsen sleep apnea and disrupt normal sleep architecture. - Sleep hygiene should be reviewed to assess factors that may improve sleep quality. - Weight management and regular exercise should be initiated or continued if appropriate.   Kara Mead MD Board Certified in [place]

## 2022-08-31 ENCOUNTER — Other Ambulatory Visit (HOSPITAL_BASED_OUTPATIENT_CLINIC_OR_DEPARTMENT_OTHER): Payer: Self-pay

## 2022-08-31 DIAGNOSIS — G4733 Obstructive sleep apnea (adult) (pediatric): Secondary | ICD-10-CM

## 2022-09-13 ENCOUNTER — Ambulatory Visit (HOSPITAL_COMMUNITY): Admission: RE | Admit: 2022-09-13 | Payer: 59 | Source: Ambulatory Visit

## 2022-10-13 ENCOUNTER — Ambulatory Visit (HOSPITAL_COMMUNITY)
Admission: RE | Admit: 2022-10-13 | Discharge: 2022-10-13 | Disposition: A | Discharge status: Home / Self Care | Payer: 59 | Source: Ambulatory Visit | Attending: Family Medicine | Admitting: Family Medicine

## 2022-10-13 DIAGNOSIS — R911 Solitary pulmonary nodule: Secondary | ICD-10-CM | POA: Insufficient documentation

## 2023-01-09 ENCOUNTER — Encounter: Payer: 59 | Admitting: Podiatry

## 2023-01-11 NOTE — Progress Notes (Signed)
Patient was a no-show for his scheduled new patient appointment today.

## 2023-02-10 ENCOUNTER — Institutional Professional Consult (permissible substitution): Payer: 59 | Admitting: Adult Health

## 2023-02-28 ENCOUNTER — Encounter: Payer: Self-pay | Admitting: Adult Health

## 2023-03-01 ENCOUNTER — Encounter: Payer: Self-pay | Admitting: Cardiology

## 2023-03-01 ENCOUNTER — Ambulatory Visit: Payer: 59 | Attending: Cardiology | Admitting: Cardiology

## 2023-03-01 VITALS — BP 154/86 | HR 92 | Ht 71.0 in | Wt 207.6 lb

## 2023-03-01 DIAGNOSIS — Z8679 Personal history of other diseases of the circulatory system: Secondary | ICD-10-CM

## 2023-03-01 DIAGNOSIS — E782 Mixed hyperlipidemia: Secondary | ICD-10-CM

## 2023-03-01 NOTE — Patient Instructions (Signed)
Medication Instructions:  The current medical regimen is effective;  continue present plan and medications.  *If you need a refill on your cardiac medications before your next appointment, please call your pharmacy*  Testing/Procedures: Your physician has requested that you have a carotid duplex. This test is an ultrasound of the carotid arteries in your neck. It looks at blood flow through these arteries that supply the brain with blood. Allow one hour for this exam. There are no restrictions or special instructions.  You have been referred to our Lipid Clinic here at our office.   Follow-Up: At [facility], you and your health needs are our priority.  As part of our continuing mission to provide you with exceptional heart care, we have created designated Provider Care Teams.  These Care Teams include your primary Cardiologist (physician) and Advanced Practice Providers (APPs -  Physician Assistants and Nurse Practitioners) who all work together to provide you with the care you need, when you need it.  We recommend signing up for the patient portal called "MyChart".  Sign up information is provided on this After Visit Summary.  MyChart is used to connect with patients for Virtual Visits (Telemedicine).  Patients are able to view lab/test results, encounter notes, upcoming appointments, etc.  Non-urgent messages can be sent to your provider as well.   To learn more about what you can do with MyChart, go to [email or web address].    Your next appointment:   1 year(s)  Provider:   Dr Donato Schultz

## 2023-03-01 NOTE — Progress Notes (Signed)
Cardiology Office Note:  .   Date:  03/01/2023  ID:  [name] [name], DOB 10/01/1960, MRN [id number] PCP: [facility]  [facility] Providers Cardiologist:  None    History of Present Illness: .   [name] [name] is a 62 y.o. male with difficult to control hyperlipidemia with statin intolerance, diabetes, prior stroke history, ambulates 3 miles a day, exercises tries to eat well had recent diabetic retinopathy diagnosed.  No chest pain no shortness of breath no new strokelike symptoms.  Mother had stroke Brother had stroke.  He has not tolerated his cholesterol medications.  LDL has been quite high in the past 142, 170.  He is also seeing nephrology because of creatinine 1.9, chronic kidney disease stage IIIa.    Studies Reviewed: Marland Kitchen   EKG Interpretation Date/Time:  Wednesday March 01 2023 08:51:11 EDT Ventricular Rate:  85 PR Interval:  144 QRS Duration:  106 QT Interval:  346 QTC Calculation: 411 R Axis:   5  Text Interpretation: Normal sinus rhythm Left ventricular hypertrophy with repolarization abnormality ( R in aVL , Romhilt-Estes ) Inferior infarct , age undetermined When compared with ECG of 24-Jul-2019 05:55, No significant change since last tracing Confirmed by Donato Schultz ([id number]) on 03/01/2023 9:15:51 AM     Risk Assessment/Calculations:           Physical Exam:   VS:  BP (!) 154/86   Pulse 92   Ht 5\' 11"  (1.803 m)   Wt 207 lb 9.6 oz (94.2 kg)   SpO2 98%   BMI 28.95 kg/m    Wt Readings from Last 3 Encounters:  03/01/23 207 lb 9.6 oz (94.2 kg)  04/21/22 200 lb 2.8 oz (90.8 kg)  02/22/22 203 lb (92.1 kg)    GEN: Well nourished, well developed in no acute distress NECK: No JVD; No carotid bruits CARDIAC: RRR, no murmurs, rubs, gallops RESPIRATORY:  Clear to auscultation without rales, wheezing or rhonchi  ABDOMEN: Soft, non-tender, non-distended EXTREMITIES:  No edema; No deformity   ASSESSMENT AND PLAN: .    Carotid artery  disease/visual changes/diabetic retinopathy/prior stroke - Will check carotid Dopplers, requested by ophthalmology.  Hyperlipidemia - Baseline LDL 170.  Did not tolerate Zetia or Crestor 40 mg.  We will send a lipid clinic.  Repatha would be excellent for him.  LDL goal less than 55 with stroke.  Hypertension - Challenging to control.  His nephrologist in [place] is now helping him with this.  He has transitioned him off of the irbesartan 300 mg because of creatinine of 1.9 and he is now on isosorbide 30 mg as well as chlorthalidone 25 mg amlodipine 10 mg Toprol 100 mg.  Prior stroke - Continue with aspirin  Diabetes - Insulin, Trulicity        Dispo: 1 yr  Signed, Donato Schultz, MD

## 2023-03-20 ENCOUNTER — Ambulatory Visit
Admission: RE | Admit: 2023-03-20 | Discharge: 2023-03-20 | Disposition: A | Discharge status: Home / Self Care | Payer: 59 | Source: Ambulatory Visit | Attending: Cardiology | Admitting: Cardiology

## 2023-03-20 ENCOUNTER — Ambulatory Visit: Payer: 59 | Admitting: Student

## 2023-03-20 ENCOUNTER — Encounter: Payer: Self-pay | Admitting: Student

## 2023-03-20 DIAGNOSIS — Z8679 Personal history of other diseases of the circulatory system: Secondary | ICD-10-CM | POA: Diagnosis present

## 2023-03-20 DIAGNOSIS — Z8673 Personal history of transient ischemic attack (TIA), and cerebral infarction without residual deficits: Secondary | ICD-10-CM | POA: Diagnosis present

## 2023-03-20 DIAGNOSIS — E782 Mixed hyperlipidemia: Secondary | ICD-10-CM

## 2023-03-20 NOTE — Progress Notes (Addendum)
 Patient ID: [name] [name]                 DOB: January 29, 1961                    MRN: [id number]      HPI: [name] [name] is a 62 y.o. male patient referred to lipid clinic by Dr.Skains. PMH is significant for HLD, statin intolerance, diabetes, prior stroke history,diabetic retinopathy, chronic kidney disease stage IIIa, T2DM.    Patient presented today for lipid clinic with his wife. Reports he tolerates Crestor  40 mg well. His nephrologist has stopped Zetia  recently as his kidney function declined. Got lipid lab done at PCP on Oct 3. Does not have access to result. Has appointment with PCP on 10/10 will be discussing with PCP. Lab result is not on [facility] so asked patient to share when he has access to lab results. Reviewed PCSK-9 inhibitors.  Discussed mechanisms of action, dosing, side effects and potential decreases in LDL cholesterol.  Also reviewed cost information and potential options for patient assistance.  Current Medications:  Crestor  40 mg daily Intolerances: none  Risk Factors: HLD, statin intolerance, diabetes, prior stroke history,diabetic retinopathy, chronic kidney disease stage IIIa, T2DM. LDL goal: <55 mg/dl    Diet: low salt , low fat, low carbohydrate diet   Exercise: walking 45 min every day   Family History:  Mother: diabetes, stroke - 28 age  Brother: stroke at age 66,  Maternal GM: diabetes  Social History:  Alcohol: none  Smoking: never   Labs: Lipid Panel     Component Value Date/Time   CHOL 228 (H) 11/24/2023 1038   TRIG 187 (H) 11/24/2023 1038   HDL 50 11/24/2023 1038   CHOLHDL 4.6 11/24/2023 1038   LDLCALC 144 (H) 11/24/2023 1038   LABVLDL 34 11/24/2023 1038    Past Medical History:  Diagnosis Date   CVA (cerebral vascular accident) (HCC) 2018   Diabetes (HCC)    Diabetes mellitus, type II (HCC)    H. pylori infection    Hypercholesteremia    Hypertension     Current Outpatient Medications on File Prior to Visit  Medication Sig Dispense Refill    B-D UF III MINI PEN NEEDLES 31G X 5 MM MISC as directed to skin three times a day for 90 days     insulin  aspart (NOVOLOG  FLEXPEN) 100 UNIT/ML FlexPen Inject 7 Units into the skin 3 (three) times daily with meals. 15 mL 4   insulin  detemir (LEVEMIR ) 100 UNIT/ML FlexPen Inject 20 Units into the skin 2 (two) times daily. 15 mL 4   metoprolol  succinate (TOPROL -XL) 100 MG 24 hr tablet Take 1 tablet (100 mg total) by mouth daily. Take with or immediately following a meal. 90 tablet 3   No current facility-administered medications on file prior to visit.    Allergies  Allergen Reactions   Morphine And Codeine Hives    Assessment/Plan:  1. Hyperlipidemia -  Problem  Mixed Hyperlipidemia   Mixed hyperlipidemia Assessment:  LDL goal: < 55 mg/dl last LDLc 857    mg/dl (90/7976) while on Crestor  40 mg daily  Tolerates high intensity statins well without any side effects  Zetia  was D/C by nephrologist ( Ezetimibe -glucuronide (metabolite) is excreted in the urine when given with statin in seeing of reduced renal function the combination increases risk of statin-associated myopathy  Discussed next potential options (PCSK-9 inhibitors, bempedoic acid and inclisiran); cost, dosing efficacy, side effects  Reiterated importance of regular  exercise & healthy diet   Plan: Continue taking current medications (Crestor  40 mg daily) Will apply for PA for PCSK9i; will inform patient upon approval  Lipid lab due in 2-3 months after starting PCSK9i    Thank you,  Robbi Blanch, Pharm.D [facility] of [facility] [address], [place], [place] [address]  Phone: [phone]; Fax: [phone]

## 2023-03-20 NOTE — Assessment & Plan Note (Signed)
Assessment:  LDL goal: < 55 mg/dl last LDLc 784    mg/dl (69/6295) while on Crestor 40 mg daily  Tolerates high intensity statins well without any side effects  Zetia was D/C by nephrologist ( Ezetimibe-glucuronide (metabolite) is excreted in the urine when given with statin in seeing of reduced renal function the combination increases risk of statin-associated myopathy  Discussed next potential options (PCSK-9 inhibitors, bempedoic acid and inclisiran); cost, dosing efficacy, side effects  Reiterated importance of regular exercise & healthy diet   Plan: Continue taking current medications (Crestor 40 mg daily) Will apply for PA for PCSK9i; will inform patient upon approval  Lipid lab due in 2-3 months after starting [facility]

## 2023-03-20 NOTE — Patient Instructions (Signed)
   Medication changes: continue taking Crestor 40 mg daily  We will start the process to get PCSK9i (Repatha or Praluent) covered by your insurance.  Once the prior authorization is complete, we will call you to let you know and confirm pharmacy information.    Praluent is a cholesterol medication that improved your body's ability to get rid of "bad cholesterol" known as LDL. It can lower your LDL up to 60%. It is an injection that is given under the skin every 2 weeks. The most common side effects of Praluent include runny nose, symptoms of the common cold, rarely flu or flu-like symptoms, back/muscle pain in about 3-4% of the patients, and redness, pain, or bruising at the injection site.    Repatha is a cholesterol medication that improved your body's ability to get rid of "bad cholesterol" known as LDL. It can lower your LDL up to 60%! It is an injection that is given under the skin every 2 weeks. The most common side effects of Repatha include runny nose, symptoms of the common cold, rarely flu or flu-like symptoms, back/muscle pain in about 3-4% of the patients, and redness, pain, or bruising at the injection site.   Lab orders: We want to repeat labs after 2-3 months.  We will send you a lab order to remind you once we get closer to that time.

## 2023-03-23 ENCOUNTER — Telehealth: Payer: Self-pay | Admitting: Cardiology

## 2023-03-23 NOTE — Telephone Encounter (Signed)
Spoke with patient and he is aware of US Carotid results he verbalized understanding.

## 2023-03-23 NOTE — Telephone Encounter (Signed)
Pt returning nurses call from yesterday regarding test results. Please advise 

## 2023-03-31 ENCOUNTER — Ambulatory Visit: Payer: 59 | Admitting: Nurse Practitioner

## 2023-04-03 ENCOUNTER — Ambulatory Visit: Payer: 59 | Admitting: Podiatry

## 2023-04-03 ENCOUNTER — Encounter: Payer: Self-pay | Admitting: Podiatry

## 2023-04-03 VITALS — Ht 71.0 in | Wt 207.6 lb

## 2023-04-03 DIAGNOSIS — L84 Corns and callosities: Secondary | ICD-10-CM | POA: Diagnosis not present

## 2023-04-03 DIAGNOSIS — B353 Tinea pedis: Secondary | ICD-10-CM | POA: Diagnosis not present

## 2023-04-03 DIAGNOSIS — E1142 Type 2 diabetes mellitus with diabetic polyneuropathy: Secondary | ICD-10-CM

## 2023-04-03 DIAGNOSIS — E119 Type 2 diabetes mellitus without complications: Secondary | ICD-10-CM

## 2023-04-03 DIAGNOSIS — Z794 Long term (current) use of insulin: Secondary | ICD-10-CM

## 2023-04-03 MED ORDER — CLOTRIMAZOLE-BETAMETHASONE 1-0.05 % EX CREA: 1.0000 | TOPICAL_CREAM | Freq: Two times a day (BID) | CUTANEOUS | 2 refills | Status: DC

## 2023-04-03 NOTE — Patient Instructions (Signed)
VISIT SUMMARY:  During your visit, we discussed your ongoing foot pain and calluses, which are likely related to your diabetes. We also addressed your athlete's foot and dry skin. Your blood sugar control has improved significantly, which is excellent news. We will continue to monitor your diabetes and foot health closely.  YOUR PLAN:  -DIABETIC PERIPHERAL NEUROPATHY: This is a condition where high blood sugar levels cause nerve damage, often in your feet. We will continue your current diabetes management to maintain or improve your blood sugar control. We also discussed the importance of checking your feet daily. If your foot pain worsens or limits your activity, we may consider a medication called gabapentin.  -TINEA PEDIS (ATHLETE'S FOOT): This is a fungal infection that causes scaling and dry skin on your feet. We prescribed a cream called Lotrisone, which you should apply twice daily. If there's no improvement or if the condition worsens in a month, please return to the clinic.  -CALLUSES: These are thick, hardened layers of skin that develop when your skin tries to protect itself against friction and pressure. We removed some of the calluses and applied a treatment. We also advised you to use an over-the-counter treatment at home.  INSTRUCTIONS:  Please continue to monitor your blood sugar levels and check your feet daily. Apply the Lotrisone cream to your feet twice daily. If your foot pain worsens or limits your activity, please contact us. Use the over-the-counter treatment (salicylic acid 40% cream or medicated pads) for your calluses as advised. If there's no improvement in your athlete's foot or if it worsens in a month, please return to the clinic.

## 2023-04-03 NOTE — Progress Notes (Signed)
Subjective:  Patient ID: [name] [name], male    DOB: [birth year],  MRN: [id number]  Chief Complaint  Patient presents with   Diabetes    Diabetic foot care/exam    Discussed the use of AI scribe software for clinical note transcription with the patient, who gave verbal consent to proceed.  History of Present Illness   The patient, a diabetic, presents with foot pain and calluses. He reports a sensation of 'nerve pain going across the toes' described as tingling and burning. This discomfort is bilateral and has been ongoing. He also mentions numbness in both big toes. The patient has been managing his foot discomfort by getting his feet done every two weeks to remove calluses. He also reports bumping his big toe two weeks ago.  His diabetes control has improved significantly, with his most recent A1c at 7.1, down from a previous high of 13. He also mentions having athlete's foot and a significant amount of dry skin on his feet.          Objective:    Physical Exam   EXTREMITIES: Palpable pulses present. SKIN: Bilateral feet warm and oily. Significant tinea pedis with scaling in moccasin distribution across bilateral plantar foot into mid arch and between toes. Painful hyperkeratotic lesion submetatarsal five on left and submetatarsal one on right. Dystrophic thickened nails with diffuse callus across tips of great toes bilaterally.       No images are attached to the encounter.    Results   Procedure: Debridement of painful skin lesions Description: The painful hyperkeratotic lesions submetatarsal five on the left and submetatarsal one on the right were debrided. Salicylic acid 40% was applied to the lesions.  LABS A1c: 13.8 (01/01/2023) A1c: 7.1 (04/03/2023)      Assessment:   1. Callus of foot   2. Type 2 diabetes mellitus with diabetic polyneuropathy, with long-term current use of insulin (HCC)   3. Tinea pedis of both feet   4. Encounter for diabetic foot exam [facility])       Plan:  Patient was evaluated and treated and all questions answered.  Assessment and Plan    Diabetic Peripheral Neuropathy   He experiences bilateral foot numbness and tingling, likely due to poorly controlled diabetes, with a recent improvement in A1c from 13 to 7.1. We emphasized the importance of blood sugar control and daily foot checks. We will continue his current diabetes management to maintain or improve his A1c. We will consider a gabapentin trial if pain worsens or limits activity.  Tinea Pedis   He presents with significant scaling and dry skin across the bilateral plantar foot and between toes. We prescribed Lotrisone cream, to be applied twice daily. He should return to the clinic if there is no improvement or if the condition worsens in one month.  Calluses   He has painful hyperkeratotic lesions under metatarsal 5 on the left foot and metatarsal 1 on the right foot. We debrided the calluses and applied salicylic acid 40%. We advised him to use over-the-counter salicylic acid 40% at home.          Return if symptoms worsen or fail to improve.

## 2023-04-25 ENCOUNTER — Ambulatory Visit (INDEPENDENT_AMBULATORY_CARE_PROVIDER_SITE_OTHER): Payer: 59 | Admitting: Adult Health

## 2023-04-25 ENCOUNTER — Encounter: Payer: Self-pay | Admitting: Adult Health

## 2023-04-25 VITALS — BP 140/82 | HR 90 | Temp 98.2°F | Ht 71.0 in | Wt 201.8 lb

## 2023-04-25 DIAGNOSIS — R918 Other nonspecific abnormal finding of lung field: Secondary | ICD-10-CM | POA: Diagnosis not present

## 2023-04-25 DIAGNOSIS — G4733 Obstructive sleep apnea (adult) (pediatric): Secondary | ICD-10-CM | POA: Diagnosis not present

## 2023-04-25 NOTE — Patient Instructions (Addendum)
Set up CT chest without contrast in 6 months   Begin CPAP At bedtime, wear all night long for at least 6hr or more  Work on healthy weight loss Do not drive if sleepy   Follow up in 3 months with Dr. Wynona Neat or Rosamund Nyland NP and As needed  (30 min slot)

## 2023-04-25 NOTE — Progress Notes (Addendum)
 @Patient  ID: [name] [name], male    DOB: November 19, 1960, 62 y.o.   MRN: [id number]  Chief Complaint  Patient presents with   Follow-up   Consult  Discussed the use of AI scribe software for clinical note transcription with the patient, who gave verbal consent to proceed.  Referring provider: Malka Sea, DO  HPI: 62 year old male seen for pulmonary and sleep consult April 25, 2023.  Wants to be checked for lung nodule first noted on chest x-ray 2007.  Also recently diagnosed with moderate sleep apnea February 2024.  TEST/EVENTS :  Split-night sleep study August 08, 2022 AHI 25.2/hour SpO2 low 78%.  CT head/neck 06/24/2021-6 mm LUL nodule   CT chest Oct 13, 2022 scattered small bilateral mostly perifissural and subpleural lung nodules largest measuring 4 mm left upper lobe nodule  04/25/2023 Consult : Lung nodule and OSA Patient presents for a consult today for lung nodule and sleep apnea. 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 The primary concern was the evaluation of small pulmonary nodules, first identified on a chest x-ray in 2007. The patient underwent a CT chest in May 2024, which revealed scattered small bilateral pulmonary nodules, the largest measuring 4mm. Despite being a never smoker with no history of COPD or asthma, the patient expressed concern due to their occupational history. The patient denied experiencing any shortness of breath, cough, unintentional weight loss, or hemoptysis.  In addition to the pulmonary consult, the patient was evaluated for sleep-related concerns. The patient reported loud snoring, restless sleep, and daytime sleepiness.  He had a sleep study done August 08, 2022 that showed moderate sleep apnea with AHI 25.2/hour and SpO2 low at 78%.  We went over in detail potential complications of untreated sleep apnea.  Patient has an extensive medical history with diabetes, stroke, hypertension,  hyperlipidemia and chronic kidney disease.  The patient was open to initiating CPAP therapy for the management of sleep apnea.      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  Never smoker. No drugs or alcohol.   Past Surgical History:  Procedure Laterality Date   VASECTOMY  64   Family hx : DM   Allergies  Allergen Reactions   Morphine And Codeine Hives    Immunization History  Administered Date(s) Administered   Influenza-Unspecified 02/23/2023   PFIZER(Purple Top)SARS-COV-2 Vaccination 05/31/2019, 06/20/2019    Past Medical History:  Diagnosis Date   CVA (cerebral vascular accident) (HCC) 2018   Diabetes (HCC)    Diabetes mellitus, type II (HCC)    H. pylori infection    Hypercholesteremia    Hypertension     Tobacco History: Social History   Tobacco Use  Smoking Status Never  Smokeless Tobacco Never   Counseling given: Not Answered   Outpatient Medications Prior to Visit  Medication Sig Dispense Refill   amLODipine  (NORVASC ) 10 MG tablet Take 1 tablet by mouth daily.     B-D UF III MINI PEN NEEDLES 31G X 5 MM MISC as directed to skin three times a day for 90 days     chlorthalidone  (HYGROTON ) 25 MG tablet Take 1 tablet (25 mg total) by mouth every morning. Hold until follow up with PCP     clotrimazole -betamethasone  (LOTRISONE ) cream Apply 1 Application topically 2 (two) times daily. 60 g 2   Dulaglutide (TRULICITY) 3 MG/0.5ML SOPN Inject 3 mLs into the skin once a week. Monday  ezetimibe  (ZETIA ) 10 MG tablet Take 10 mg by mouth daily.     insulin  aspart (NOVOLOG  FLEXPEN) 100 UNIT/ML FlexPen Inject 7 Units into the skin 3 (three) times daily with meals. 15 mL 4   insulin  detemir (LEVEMIR ) 100 UNIT/ML FlexPen Inject 20 Units into the skin 2 (two) times daily. 15 mL 4   irbesartan  (AVAPRO ) 300 MG tablet Take 1 tablet (300 mg total) by mouth daily. Hold until follow up with PCP     isosorbide  mononitrate (IMDUR ) 30 MG 24 hr  tablet Take 1 tablet (30 mg total) by mouth daily. 30 tablet 2   JARDIANCE 10 MG TABS tablet Take 10 mg by mouth daily.     metFORMIN (GLUCOPHAGE) 500 MG tablet Take 500 mg by mouth daily.     metoprolol  succinate (TOPROL -XL) 100 MG 24 hr tablet Take 1 tablet (100 mg total) by mouth daily. Take with or immediately following a meal. 90 tablet 3   MOUNJARO 7.5 MG/0.5ML Pen Inject 7.5 mg into the skin once a week.     rosuvastatin  (CRESTOR ) 40 MG tablet Take 40 mg by mouth daily.     tamsulosin  (FLOMAX ) 0.4 MG CAPS capsule Take 0.4 mg by mouth at bedtime.     No facility-administered medications prior to visit.     Review of Systems:   Constitutional:   No  weight loss, night sweats,  Fevers, chills, +fatigue, or  lassitude.  HEENT:   No headaches,  Difficulty swallowing,  Tooth/dental problems, or  Sore throat,                No sneezing, itching, ear ache, nasal congestion, post nasal drip,   CV:  No chest pain,  Orthopnea, PND, swelling in lower extremities, anasarca, dizziness, palpitations, syncope.   GI  No heartburn, indigestion, abdominal pain, nausea, vomiting, diarrhea, change in bowel habits, loss of appetite, bloody stools.   Resp: No shortness of breath with exertion or at rest.  No excess mucus, no productive cough,  No non-productive cough,  No coughing up of blood.  No change in color of mucus.  No wheezing.  No chest wall deformity  Skin: no rash or lesions.  GU: no dysuria, change in color of urine, no urgency or frequency.  No flank pain, no hematuria   MS:  No joint pain or swelling.  No decreased range of motion.  No back pain.    Physical Exam  BP (!) 140/82 (BP Location: Left Arm, Patient Position: Sitting, Cuff Size: Normal)   Pulse 90   Temp 98.2 F (36.8 C) (Oral)   Ht 5\' 11"  (1.803 m)   Wt 201 lb 12.8 oz (91.5 kg)   SpO2 100%   BMI 28.15 kg/m   GEN: A/Ox3; pleasant , NAD, well nourished    HEENT:  [place]/AT,  NOSE-clear, THROAT-clear, no lesions, no  postnasal drip or exudate noted.  Class 3 MP airway   NECK:  Supple w/ fair ROM; no JVD; normal carotid impulses w/o bruits; no thyromegaly or nodules palpated; no lymphadenopathy.    RESP  Clear  P & A; w/o, wheezes/ rales/ or rhonchi. no accessory muscle use, no dullness to percussion  CARD:  RRR, no m/r/g, no peripheral edema, pulses intact, no cyanosis or clubbing.  GI:   Soft & nt; nml bowel sounds; no organomegaly or masses detected.   Musco: Warm bil, no deformities or joint swelling noted.   Neuro: alert, no focal deficits noted.    Skin:  Warm, no lesions or rashes    Lab Results:  CBC   BMET   BNP No results found for: "BNP"  ProBNP No results found for: "PROBNP"  Imaging: No results found.  Administration History     None           No data to display          No results found for: "NITRICOXIDE"      Assessment & Plan:  Assessment and Plan    Pulmonary Nodules Scattered small bilateral pulmonary nodules, with the largest measuring 4 mm, were first identified on a CT scan in May 2024. As a retired IT sales professional who has never smoked and has no history of COPD or asthma, they deny experiencing dyspnea, cough, weight loss, or hemoptysis. The nodules have remained unchanged since 2023.  Their non-smoker status significantly reduces the risk of malignancy. We will order a follow-up CT chest in May 2025 and schedule a follow-up appointment in early June 2025 to review the CT results.  Moderate Obstructive Sleep Apnea Diagnosed with moderate obstructive sleep apnea (OSA) in February 2024, they presented with an Apnea-Hypopnea Index (AHI) of 25.2 and a nadir SpO2 of 78%. Symptoms include snoring, restless sleep, and daytime sleepiness. Untreated OSA can exacerbate existing conditions such as diabetes, chronic kidney disease (CKD), and hypertension, and increase the risk of stroke and cardiovascular events. We discussed initiating treatment with auto CPAP  at 5-15 cm H2O as the first-line treatment and the potential for considering the Inspire device if they are intolerant to CPAP. We also covered the risks of untreated OSA and the benefits of CPAP therapy. Education on CPAP use and mask fitting was provided,  A follow-up in 3 months has been scheduled to review CPAP efficacy and adherence.  General Health Maintenance They lead an active lifestyle, engaging in yard work, and abstain from smoking, alcohol, and drug use. Their medical history includes hypertension, type 2 diabetes, hyperlipidemia, CKD stage 3, and a previous stroke.  The importance of managing their chronic conditions to reduce health risks was discussed. They will continue their medications for hypertension, diabetes, and hyperlipidemia, follow up with a nephrologist for CKD management, and maintain regular follow-ups with their cardiologist and PCP.   Follow-up A follow-up in 3 months is planned to assess CPAP efficacy and adherence.           I spent   45 minutes dedicated to the care of this patient on the date of this encounter to include pre-visit review of records, face-to-face time with the patient discussing conditions above, post visit ordering of testing, clinical documentation with the electronic health record, making appropriate referrals as documented, and communicating necessary findings to members of the patients care team.   Roena Clark, NP 04/25/2023

## 2023-05-15 ENCOUNTER — Ambulatory Visit: Payer: 59 | Admitting: Urology

## 2023-05-15 VITALS — BP 175/93 | HR 94

## 2023-05-15 DIAGNOSIS — N5201 Erectile dysfunction due to arterial insufficiency: Secondary | ICD-10-CM

## 2023-05-15 MED ORDER — PROSTAGLANDIN E1 POWD: 1.0000 mL | Freq: Every day | 0 refills | Status: DC | PRN

## 2023-05-15 NOTE — Progress Notes (Unsigned)
05/15/2023 9:54 AM   [name] [name] [birth year] [id number]  Referring provider: [facility] [address] [place],  [place] [address]  No chief complaint on file.   HPI:  New pt -   1) ED - since 2022 troubling getting and maintaining erection. He tried sildenafil and tadalafil with good success. No dizziness. On imdur. No GU surgery or scrotal surgery. He has three kids. Comorbidities include HTN, DM, HLP.  He has a good libido.   2) BPH - he is on tamsulosin. Noc x 3-4.   Today, seen for the above.   He retired from [employer] in 2021.   PMH: Past Medical History:  Diagnosis Date   CVA (cerebral vascular accident) (HCC) 2018   Diabetes (HCC)    Diabetes mellitus, type II (HCC)    H. pylori infection    Hypercholesteremia    Hypertension     Surgical History: Past Surgical History:  Procedure Laterality Date   VASECTOMY  1996    Home Medications:  Allergies as of 05/15/2023       Reactions   Morphine And Codeine Hives        Medication List        Accurate as of May 15, 2023  9:54 AM. If you have any questions, ask your nurse or doctor.          amLODipine 10 MG tablet Commonly known as: NORVASC Take 1 tablet by mouth daily.   B-D UF III MINI PEN NEEDLES 31G X 5 MM Misc Generic drug: Insulin Pen Needle as directed to skin three times a day for 90 days   chlorthalidone 25 MG tablet Commonly known as: HYGROTON Take 1 tablet (25 mg total) by mouth every morning. Hold until follow up with PCP   clotrimazole-betamethasone cream Commonly known as: LOTRISONE Apply 1 Application topically 2 (two) times daily.   ezetimibe 10 MG tablet Commonly known as: ZETIA Take 10 mg by mouth daily.   insulin detemir 100 UNIT/ML FlexPen Commonly known as: LEVEMIR Inject 20 Units into the skin 2 (two) times daily.   irbesartan 300 MG tablet Commonly known as: Avapro Take 1 tablet (300 mg total) by mouth daily. Hold until  follow up with PCP   isosorbide mononitrate 30 MG 24 hr tablet Commonly known as: IMDUR Take 1 tablet (30 mg total) by mouth daily.   Jardiance 10 MG Tabs tablet Generic drug: empagliflozin Take 10 mg by mouth daily.   metFORMIN 500 MG tablet Commonly known as: GLUCOPHAGE Take 500 mg by mouth daily.   metoprolol succinate 100 MG 24 hr tablet Commonly known as: TOPROL-XL Take 1 tablet (100 mg total) by mouth daily. Take with or immediately following a meal.   Mounjaro 7.5 MG/0.5ML Pen Generic drug: tirzepatide Inject 7.5 mg into the skin once a week.   NovoLOG FlexPen 100 UNIT/ML FlexPen Generic drug: insulin aspart Inject 7 Units into the skin 3 (three) times daily with meals.   rosuvastatin 40 MG tablet Commonly known as: CRESTOR Take 40 mg by mouth daily.   tamsulosin 0.4 MG Caps capsule Commonly known as: FLOMAX Take 0.4 mg by mouth at bedtime.   Trulicity 3 MG/0.5ML Soaj Generic drug: Dulaglutide Inject 3 mLs into the skin once a week. Monday        Allergies:  Allergies  Allergen Reactions   Morphine And Codeine Hives    Family History: Family History  Problem Relation Age of Onset  Diabetes Mother    Colon cancer Neg Hx    Stomach cancer Neg Hx    Rectal cancer Neg Hx    Esophageal cancer Neg Hx     Social History:  reports that he has never smoked. He has never used smokeless tobacco. He reports that he does not currently use alcohol. He reports that he does not use drugs.   Physical Exam: BP (!) 175/93   Pulse 94   Constitutional:  Alert and oriented, No acute distress. HEENT: [place] AT, moist mucus membranes.  Trachea midline, no masses. Cardiovascular: No clubbing, cyanosis, or edema. Respiratory: Normal respiratory effort, no increased work of breathing. GI: Abdomen is soft, nontender, nondistended, no abdominal masses GU: No CVA tenderness Skin: No rashes, bruises or suspicious lesions. Neurologic: Grossly intact, no focal deficits,  moving all 4 extremities. Psychiatric: Normal mood and affect.  Laboratory Data: Lab Results  Component Value Date   WBC 4.4 04/20/2022   HGB 16.3 04/20/2022   HCT 46.3 04/20/2022   MCV 83.0 04/20/2022   PLT 278 04/20/2022    Lab Results  Component Value Date   CREATININE 1.89 (H) 04/22/2022    No results found for: "PSA"  No results found for: "TESTOSTERONE"  Lab Results  Component Value Date   HGBA1C 12.4 (H) 04/20/2022    Urinalysis    Component Value Date/Time   COLORURINE STRAW (A) 04/20/2022 1056   APPEARANCEUR CLEAR 04/20/2022 1056   LABSPEC 1.025 04/20/2022 1056   PHURINE 5.0 04/20/2022 1056   GLUCOSEU >=500 (A) 04/20/2022 1056   HGBUR NEGATIVE 04/20/2022 1056   BILIRUBINUR NEGATIVE 04/20/2022 1056   KETONESUR NEGATIVE 04/20/2022 1056   PROTEINUR NEGATIVE 04/20/2022 1056   NITRITE NEGATIVE 04/20/2022 1056   LEUKOCYTESUR NEGATIVE 04/20/2022 1056    Lab Results  Component Value Date   BACTERIA NONE SEEN 04/20/2022    Pertinent Imaging: N/a    Assessment & Plan:    1) ED - discussed etiology. Disc nature r/b of pde5i, VED, ICI and IPP. Also off label use of LSIWT. He will try injection. He knows to "go to ER for 4 hr erection". Also disc scarring and curvature among others.   No follow-ups on file.  Jerilee Field, MD  [facility]  [address] [place], [place] [address] [phone]

## 2023-05-16 LAB — PSA: Prostate Specific Ag, Serum: 0.2 ng/mL (ref 0.0–4.0)

## 2023-06-11 DIAGNOSIS — N183 Chronic kidney disease, stage 3 unspecified: Secondary | ICD-10-CM | POA: Insufficient documentation

## 2023-06-12 ENCOUNTER — Ambulatory Visit: Payer: 59

## 2023-06-30 ENCOUNTER — Ambulatory Visit: Payer: 59

## 2023-07-18 ENCOUNTER — Other Ambulatory Visit (HOSPITAL_COMMUNITY): Payer: Self-pay | Admitting: Family Medicine

## 2023-07-18 DIAGNOSIS — N289 Disorder of kidney and ureter, unspecified: Secondary | ICD-10-CM

## 2023-07-25 ENCOUNTER — Ambulatory Visit: Payer: 59 | Admitting: Adult Health

## 2023-07-31 ENCOUNTER — Ambulatory Visit: Payer: 59 | Admitting: Urology

## 2023-08-04 ENCOUNTER — Ambulatory Visit: Payer: 59 | Admitting: Adult Health

## 2023-08-14 ENCOUNTER — Ambulatory Visit: Payer: 59 | Admitting: Urology

## 2023-08-14 VITALS — BP 166/80 | HR 103

## 2023-08-14 DIAGNOSIS — N5201 Erectile dysfunction due to arterial insufficiency: Secondary | ICD-10-CM

## 2023-08-14 MED ORDER — PROSTAGLANDIN E1 POWD: 0 refills | Status: DC

## 2023-08-14 NOTE — Progress Notes (Unsigned)
 08/14/2023 2:29 PM   [name] [name] 15-Feb-1961 [id number]  Referring provider: [facility] [address] [place],  [place] [address]  No chief complaint on file.   HPI:  F/u -    1) ED - since 2022 troubling getting and maintaining erection. He tried sildenafil and tadalafil with good success. No dizziness. On imdur. No GU surgery or scrotal surgery. He has three kids. Comorbidities include HTN, DM, HLP.  He has a good libido.    2) BPH - he is on tamsulosin. Noc x 3-4.    Today, seen for the above. His Dec 2024 PSA 0.2. 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 We had to reschedule to due weather. 0.5 of 20 mcg did not work. He got a "sensation" but no erection.    He retired from [employer] in 2021.   PMH: Past Medical History:  Diagnosis Date   CVA (cerebral vascular accident) (HCC) 2018   Diabetes (HCC)    Diabetes mellitus, type II (HCC)    H. pylori infection    Hypercholesteremia    Hypertension     Surgical History: Past Surgical History:  Procedure Laterality Date   VASECTOMY  1996    Home Medications:  Allergies as of 08/14/2023       Reactions   Morphine And Codeine Hives        Medication List        Accurate as of August 14, 2023  2:29 PM. If you have any questions, ask your nurse or doctor.          amLODipine 10 MG tablet Commonly known as: NORVASC Take 1 tablet by mouth daily.   B-D UF III MINI PEN NEEDLES 31G X 5 MM Misc Generic drug: Insulin Pen Needle as directed to skin three times a day for 90 days   chlorthalidone 25 MG tablet Commonly known as: HYGROTON Take 1 tablet (25 mg total) by mouth every morning. Hold until follow up with PCP   clotrimazole-betamethasone cream Commonly known as: LOTRISONE Apply 1 Application topically 2 (two) times daily.   ezetimibe 10 MG tablet Commonly known as: ZETIA Take 10 mg by mouth daily.   insulin detemir 100 UNIT/ML FlexPen Commonly  known as: LEVEMIR Inject 20 Units into the skin 2 (two) times daily.   irbesartan 300 MG tablet Commonly known as: Avapro Take 1 tablet (300 mg total) by mouth daily. Hold until follow up with PCP   isosorbide mononitrate 30 MG 24 hr tablet Commonly known as: IMDUR Take 1 tablet (30 mg total) by mouth daily.   Jardiance 10 MG Tabs tablet Generic drug: empagliflozin Take 10 mg by mouth daily.   metFORMIN 500 MG tablet Commonly known as: GLUCOPHAGE Take 500 mg by mouth daily.   metoprolol succinate 100 MG 24 hr tablet Commonly known as: TOPROL-XL Take 1 tablet (100 mg total) by mouth daily. Take with or immediately following a meal.   Mounjaro 7.5 MG/0.5ML Pen Generic drug: tirzepatide Inject 7.5 mg into the skin once a week.   NovoLOG FlexPen 100 UNIT/ML FlexPen Generic drug: insulin aspart Inject 7 Units into the skin 3 (three) times daily with meals.   Prostaglandin E1 Powd 1 mL by Intracavernosal route daily as needed.   rosuvastatin 40 MG tablet Commonly known as: CRESTOR Take 40 mg by mouth daily.   tamsulosin 0.4 MG Caps capsule Commonly known as: FLOMAX Take 0.4 mg by mouth  at bedtime.   Trulicity 3 MG/0.5ML Soaj Generic drug: Dulaglutide Inject 3 mLs into the skin once a week. Monday        Allergies:  Allergies  Allergen Reactions   Morphine And Codeine Hives    Family History: Family History  Problem Relation Age of Onset   Diabetes Mother    Colon cancer Neg Hx    Stomach cancer Neg Hx    Rectal cancer Neg Hx    Esophageal cancer Neg Hx     Social History:  reports that he has never smoked. He has never used smokeless tobacco. He reports that he does not currently use alcohol. He reports that he does not use drugs.   Physical Exam: BP (!) 166/80   Pulse (!) 103   Constitutional:  Alert and oriented, No acute distress. HEENT: [place] AT, moist mucus membranes.  Trachea midline, no masses. Cardiovascular: No clubbing, cyanosis, or  edema. Respiratory: Normal respiratory effort, no increased work of breathing. GI: Abdomen is soft, nontender, nondistended, no abdominal masses GU: No CVA tenderness Skin: No rashes, bruises or suspicious lesions. Neurologic: Grossly intact, no focal deficits, moving all 4 extremities. Psychiatric: Normal mood and affect.  Laboratory Data: Lab Results  Component Value Date   WBC 4.4 04/20/2022   HGB 16.3 04/20/2022   HCT 46.3 04/20/2022   MCV 83.0 04/20/2022   PLT 278 04/20/2022    Lab Results  Component Value Date   CREATININE 1.89 (H) 04/22/2022    No results found for: "PSA"  No results found for: "TESTOSTERONE"  Lab Results  Component Value Date   HGBA1C 12.4 (H) 04/20/2022    Urinalysis    Component Value Date/Time   COLORURINE STRAW (A) 04/20/2022 1056   APPEARANCEUR CLEAR 04/20/2022 1056   LABSPEC 1.025 04/20/2022 1056   PHURINE 5.0 04/20/2022 1056   GLUCOSEU >=500 (A) 04/20/2022 1056   HGBUR NEGATIVE 04/20/2022 1056   BILIRUBINUR NEGATIVE 04/20/2022 1056   KETONESUR NEGATIVE 04/20/2022 1056   PROTEINUR NEGATIVE 04/20/2022 1056   NITRITE NEGATIVE 04/20/2022 1056   LEUKOCYTESUR NEGATIVE 04/20/2022 1056    Lab Results  Component Value Date   LABMICR 5.1 11/25/2021   BACTERIA NONE SEEN 04/20/2022    Pertinent Imaging: N/a   Assessment & Plan:    1) ED - discussed increase to 1 ml.   No follow-ups on file.  Jerilee Field, MD  [facility]  [address] [place], [place] [address] [phone]

## 2023-08-24 ENCOUNTER — Encounter: Payer: Self-pay | Admitting: Cardiology

## 2023-08-24 NOTE — Telephone Encounter (Signed)
 error

## 2023-09-28 ENCOUNTER — Encounter (HOSPITAL_COMMUNITY): Payer: Self-pay | Admitting: *Deleted

## 2023-09-28 ENCOUNTER — Other Ambulatory Visit: Payer: Self-pay

## 2023-09-28 ENCOUNTER — Emergency Department (HOSPITAL_COMMUNITY)
Admission: EM | Admit: 2023-09-28 | Discharge: 2023-09-28 | Disposition: A | Discharge status: Home / Self Care | Attending: Emergency Medicine | Admitting: Emergency Medicine

## 2023-09-28 ENCOUNTER — Emergency Department (HOSPITAL_COMMUNITY)

## 2023-09-28 DIAGNOSIS — Z794 Long term (current) use of insulin: Secondary | ICD-10-CM | POA: Insufficient documentation

## 2023-09-28 DIAGNOSIS — K6289 Other specified diseases of anus and rectum: Secondary | ICD-10-CM | POA: Insufficient documentation

## 2023-09-28 LAB — CBC WITH DIFFERENTIAL/PLATELET
Abs Immature Granulocytes: 0.02 10*3/uL (ref 0.00–0.07)
Basophils Absolute: 0 10*3/uL (ref 0.0–0.1)
Basophils Relative: 0 %
Eosinophils Absolute: 0.2 10*3/uL (ref 0.0–0.5)
Eosinophils Relative: 3 %
HCT: 36.9 % — ABNORMAL LOW (ref 39.0–52.0)
Hemoglobin: 12.6 g/dL — ABNORMAL LOW (ref 13.0–17.0)
Immature Granulocytes: 0 %
Lymphocytes Relative: 32 %
Lymphs Abs: 1.6 10*3/uL (ref 0.7–4.0)
MCH: 29.2 pg (ref 26.0–34.0)
MCHC: 34.1 g/dL (ref 30.0–36.0)
MCV: 85.4 fL (ref 80.0–100.0)
Monocytes Absolute: 0.4 10*3/uL (ref 0.1–1.0)
Monocytes Relative: 8 %
Neutro Abs: 2.8 10*3/uL (ref 1.7–7.7)
Neutrophils Relative %: 57 %
Platelets: 277 10*3/uL (ref 150–400)
RBC: 4.32 MIL/uL (ref 4.22–5.81)
RDW: 12.8 % (ref 11.5–15.5)
WBC: 5.1 10*3/uL (ref 4.0–10.5)
nRBC: 0 % (ref 0.0–0.2)

## 2023-09-28 LAB — COMPREHENSIVE METABOLIC PANEL WITH GFR
ALT: 23 U/L (ref 0–44)
AST: 22 U/L (ref 15–41)
Albumin: 3.7 g/dL (ref 3.5–5.0)
Alkaline Phosphatase: 65 U/L (ref 38–126)
Anion gap: 9 (ref 5–15)
BUN: 29 mg/dL — ABNORMAL HIGH (ref 6–20)
CO2: 25 mmol/L (ref 22–32)
Calcium: 9.1 mg/dL (ref 8.9–10.3)
Chloride: 99 mmol/L (ref 98–111)
Creatinine, Ser: 2.12 mg/dL — ABNORMAL HIGH (ref 0.61–1.24)
GFR, Estimated: 35 mL/min — ABNORMAL LOW (ref >60)
Glucose, Bld: 205 mg/dL — ABNORMAL HIGH (ref 70–99)
Potassium: 3.9 mmol/L (ref 3.5–5.1)
Sodium: 133 mmol/L — ABNORMAL LOW (ref 135–145)
Total Bilirubin: 1 mg/dL (ref 0.0–1.2)
Total Protein: 7.1 g/dL (ref 6.5–8.1)

## 2023-09-28 MED ORDER — DOCUSATE SODIUM 100 MG PO CAPS: 100.0000 mg | ORAL_CAPSULE | Freq: Two times a day (BID) | ORAL | 0 refills | Status: DC

## 2023-09-28 MED ORDER — IOHEXOL 300 MG/ML  SOLN
80.0000 mL | Freq: Once | INTRAMUSCULAR | Status: AC | PRN
Start: 2023-09-28 — End: 2023-09-28
  Administered 2023-09-28: 80 mL via INTRAVENOUS

## 2023-09-28 NOTE — Discharge Instructions (Signed)
 Follow-up with your gastroenterologist or you can see Dr. Alita Irwin here in [place] or one of his associates.  Drink plenty of fluids.

## 2023-09-28 NOTE — ED Notes (Addendum)
 See triage notes. Pt abd distended and slightly firm, non tender. Pt states abd is bigger than usual. Denies any problems urinating. A/o. States LNBM was 09/26/23

## 2023-09-28 NOTE — ED Triage Notes (Signed)
 Pt c/o difficulty having a BM, straining, and also rectal and abdominal pain afterwards. Pt reports "stringy" or "small balls" of stool.

## 2023-09-28 NOTE — ED Provider Notes (Signed)
 [place] [facility]MENT AT [facility] Provider Note   CSN: [id number] Arrival date & time: 09/28/23  2956     History  Chief Complaint  Patient presents with   Rectal Pain    [name] [name] is a 63 y.o. male.  Patient complains of rectal pain worse when he is having a bowel movement  The history is provided by the patient and medical records. No language interpreter was used.  Abdominal Pain Pain location: Rectal pain. Pain quality: aching   Pain radiates to:  Does not radiate Pain severity:  Moderate Onset quality:  Sudden Timing:  Intermittent Progression:  Resolved Associated symptoms: no chest pain, no cough, no diarrhea, no fatigue and no hematuria        Home Medications Prior to Admission medications   Medication Sig Start Date End Date Taking? Authorizing Provider  docusate sodium (COLACE) 100 MG capsule Take 1 capsule (100 mg total) by mouth every 12 (twelve) hours. 09/28/23  Yes Omesha Bowerman, MD  Alprostadil (PROSTAGLANDIN E1) POWD 20 mcg/ml PGE. Inject 1 ml intracavernosal PRN; disp #5 ml, 3 refill 08/14/23   Christina Coyer, MD  amLODipine (NORVASC) 10 MG tablet Take 1 tablet by mouth daily. 03/06/20   [provider]  B-D UF III MINI PEN NEEDLES 31G X 5 MM MISC as directed to skin three times a day for 90 days    [provider]  chlorthalidone (HYGROTON) 25 MG tablet Take 1 tablet (25 mg total) by mouth every morning. Hold until follow up with PCP 04/22/22   Justina Oman, MD  clotrimazole-betamethasone (LOTRISONE) cream Apply 1 Application topically 2 (two) times daily. 04/03/23   McDonald, Olive Better, DPM  Dulaglutide (TRULICITY) 3 MG/0.5ML SOPN Inject 3 mLs into the skin once a week. Monday    [provider]  ezetimibe (ZETIA) 10 MG tablet Take 10 mg by mouth daily. 11/29/21   [provider]  insulin aspart (NOVOLOG FLEXPEN) 100 UNIT/ML FlexPen Inject 7 Units into the skin 3 (three) times daily with  meals. 04/22/22   Justina Oman, MD  insulin detemir (LEVEMIR) 100 UNIT/ML FlexPen Inject 20 Units into the skin 2 (two) times daily. 04/22/22   Justina Oman, MD  irbesartan (AVAPRO) 300 MG tablet Take 1 tablet (300 mg total) by mouth daily. Hold until follow up with PCP Patient not taking: Reported on 08/14/2023 04/22/22   Justina Oman, MD  isosorbide mononitrate (IMDUR) 30 MG 24 hr tablet Take 1 tablet (30 mg total) by mouth daily. 04/23/22   Justina Oman, MD  JARDIANCE 10 MG TABS tablet Take 10 mg by mouth daily.    [provider]  metFORMIN (GLUCOPHAGE) 500 MG tablet Take 500 mg by mouth daily. 10/18/22   [provider]  metoprolol succinate (TOPROL-XL) 100 MG 24 hr tablet Take 1 tablet (100 mg total) by mouth daily. Take with or immediately following a meal. 01/25/22   Hugh Madura, MD  MOUNJARO 7.5 MG/0.5ML Pen Inject 7.5 mg into the skin once a week. 02/11/23   [provider]  rosuvastatin (CRESTOR) 40 MG tablet Take 40 mg by mouth daily.    [provider]  tamsulosin (FLOMAX) 0.4 MG CAPS capsule Take 0.4 mg by mouth at bedtime. 04/20/21   [provider]      Allergies    Morphine and codeine    Review of Systems   Review of Systems  Constitutional:  Negative for appetite change and fatigue.  HENT:  Negative  for congestion, ear discharge and sinus pressure.   Eyes:  Negative for discharge.  Respiratory:  Negative for cough.   Cardiovascular:  Negative for chest pain.  Gastrointestinal:  Negative for diarrhea.       Rectal pain  Genitourinary:  Negative for frequency and hematuria.  Musculoskeletal:  Negative for back pain.  Skin:  Negative for rash.  Neurological:  Negative for seizures and headaches.  Psychiatric/Behavioral:  Negative for hallucinations.     Physical Exam Updated Vital Signs BP (!) 143/85 (BP Location: Left Arm)   Pulse 79   Temp 98.2 F (36.8 C) (Oral)   Resp 18   Ht 5\' 11"  (1.803 m)   Wt 91.2 kg    SpO2 100%   BMI 28.03 kg/m  Physical Exam Vitals reviewed.  Constitutional:      Appearance: He is well-developed.  HENT:     Head: Normocephalic.     Nose: Nose normal.  Eyes:     General: No scleral icterus.    Conjunctiva/sclera: Conjunctivae normal.  Neck:     Thyroid: No thyromegaly.  Cardiovascular:     Rate and Rhythm: Normal rate and regular rhythm.     Heart sounds: No murmur heard.    No friction rub. No gallop.  Pulmonary:     Breath sounds: No stridor. No wheezing or rales.  Chest:     Chest wall: No tenderness.  Abdominal:     General: There is no distension.     Tenderness: There is no abdominal tenderness. There is no rebound.  Genitourinary:    Comments: Patient with possible internal hemorrhoids on exam Musculoskeletal:        General: Normal range of motion.     Cervical back: Neck supple.  Lymphadenopathy:     Cervical: No cervical adenopathy.  Skin:    Findings: No erythema or rash.  Neurological:     Mental Status: He is alert and oriented to person, place, and time.     Motor: No abnormal muscle tone.     Coordination: Coordination normal.  Psychiatric:        Behavior: Behavior normal.     ED Results / Procedures / Treatments   Labs (all labs ordered are listed, but only abnormal results are displayed) Labs Reviewed  CBC WITH DIFFERENTIAL/PLATELET - Abnormal; Notable for the following components:      Result Value   Hemoglobin 12.6 (*)    HCT 36.9 (*)    All other components within normal limits  COMPREHENSIVE METABOLIC PANEL WITH GFR - Abnormal; Notable for the following components:   Sodium 133 (*)    Glucose, Bld 205 (*)    BUN 29 (*)    Creatinine, Ser 2.12 (*)    GFR, Estimated 35 (*)    All other components within normal limits    EKG None  Radiology CT ABDOMEN PELVIS W CONTRAST Result Date: 09/28/2023 CLINICAL DATA:  Abdominal pain, acute, nonlocalized EXAM: CT ABDOMEN AND PELVIS WITH CONTRAST TECHNIQUE: Multidetector CT  imaging of the abdomen and pelvis was performed using the standard protocol following bolus administration of intravenous contrast. RADIATION DOSE REDUCTION: This exam was performed according to the departmental dose-optimization program which includes automated exposure control, adjustment of the mA and/or kV according to patient size and/or use of iterative reconstruction technique. CONTRAST:  80mL OMNIPAQUE IOHEXOL 300 MG/ML  SOLN COMPARISON:  None Available. FINDINGS: Lower chest: No focal airspace consolidation or pleural effusion.Unchanged 3 mm right lower lobe nodule (axial  5). Hepatobiliary: No mass.No radiopaque stones or wall thickening of the gallbladder.No intrahepatic or extrahepatic biliary ductal dilation.The portal veins are patent. Pancreas: No mass or main ductal dilation.No peripancreatic inflammation or fluid collection. Spleen: Normal size. No mass. Adrenals/Urinary Tract: No adrenal masses. 9 mm hypodensity with thin peripheral calcification in the upper pole of the right kidney. A couple of additional subcentimeter hypodensities also noted in both kidneys. No nephrolithiasis or hydronephrosis. The urinary bladder is distended without focal abnormality. Stomach/Bowel: Mild circumferential wall thickening of the distal esophagus. The stomach is decompressed without focal abnormality. No small bowel wall thickening or inflammation. No small bowel obstruction. Normal appendix. Vascular/Lymphatic: No aortic aneurysm. Diffuse aortoiliac atherosclerosis. No intraabdominal or pelvic lymphadenopathy. Reproductive: No prostatomegaly.No free pelvic fluid. Other: No pneumoperitoneum, ascites, or mesenteric inflammation. Musculoskeletal: No acute fracture or destructive lesion.Small fat containing umbilical hernia. Multilevel degenerative disc disease of the spine. IMPRESSION: 1. No acute intra-abdominal or pelvic abnormality. 2. Mild circumferential wall thickening of the distal esophagus, which may be  due to underdistention or gastroesophageal reflux disease. 3. A few subcentimeter hypodensities are noted in both kidneys, 1 of which in the upper pole of the right kidney, contains thin peripheral calcification. While these are too small to definitively characterize, they all favored to represent benign cysts. Electronically Signed   By: Rance Burrows M.D.   On: 09/28/2023 11:54    Procedures Procedures    Medications Ordered in ED Medications  iohexol (OMNIPAQUE) 300 MG/ML solution 80 mL (80 mLs Intravenous Contrast Given 09/28/23 0957)    ED Course/ Medical Decision Making/ A&P                                 Medical Decision Making Amount and/or Complexity of Data Reviewed Labs: ordered. Radiology: ordered.  Risk OTC drugs. Prescription drug management.  Patient with rectal pain.  He is started on stool softeners and will follow-up with GI        Final Clinical Impression(s) / ED Diagnoses Final diagnoses:  Rectal pain    Rx / DC Orders ED Discharge Orders          Ordered    docusate sodium (COLACE) 100 MG capsule  Every 12 hours        09/28/23 1233              Ariez Neilan, MD 09/28/23 1745

## 2023-10-06 ENCOUNTER — Telehealth: Payer: Self-pay | Admitting: Adult Health

## 2023-10-06 NOTE — Telephone Encounter (Signed)
 Pt is scheduled to see Roena Clark, NP Monday 10/09/23 Has not had CT Chest yet  Also, nothing showing he is using his CPAP   I called to ask him about CPAP use and see if he can reschedule until after we can get his CT done  He refused to reschedule, and states he is unable to use the CPAP and would like to keep appt so he can talk with TP about it  Will keep appt and discuss further then   Routing to [place] and TP as FYI  Also adding [facility] to set up the CT

## 2023-10-06 NOTE — Telephone Encounter (Signed)
 CT has been scheduled and pt is aware

## 2023-10-09 ENCOUNTER — Ambulatory Visit: Admitting: Adult Health

## 2023-10-09 ENCOUNTER — Encounter: Payer: Self-pay | Admitting: Adult Health

## 2023-10-09 VITALS — BP 170/90 | HR 89 | Temp 98.1°F | Ht 71.0 in | Wt 196.4 lb

## 2023-10-09 DIAGNOSIS — G4733 Obstructive sleep apnea (adult) (pediatric): Secondary | ICD-10-CM | POA: Insufficient documentation

## 2023-10-09 DIAGNOSIS — R911 Solitary pulmonary nodule: Secondary | ICD-10-CM | POA: Diagnosis not present

## 2023-10-09 NOTE — Patient Instructions (Addendum)
 CT chest without contrast  as planned next month   May return CPAP  Work on healthy weight loss Do not drive if sleepy  Sleep at head of bed elevated at 30 degree incline  Avoid sleeping on your back .  Can discuss Inspire device further at follow up   Follow up in 6 week  Dr. Gaynell Keeler or Dallon Dacosta NP and As needed  (30 min slot)

## 2023-10-09 NOTE — Progress Notes (Signed)
 @Patient  ID: [name] [name], male    DOB: Jun 28, 1960, 63 y.o.   MRN: [id number]  Chief Complaint  Patient presents with   Follow-up    Referring provider: [facility]  HPI: 63 yo male never smoker seen for consult November 2024 for lung nodule and sleep apnea  Medical history significant for hypertension, hyperlipidemia, chronic kidney disease, diabetes  TEST/EVENTS :  Split-night sleep study August 08, 2022 AHI 25.2/hour SpO2 low 78%.   CT chest Oct 13, 2022 scattered small bilateral mostly perifissural and subpleural lung nodules largest measuring 4 mm left upper lobe nodule   10/09/2023 Follow up : Lung nodule and Sleep apnea  63 year old male seen for consult November 2024.  Patient is a retired IT sales professional.  He is a never smoker.  Patient came in after CT chest showed several scattered small pulmonary nodules.  These were first identified on chest x-ray in 2007.  CT chest May 2024 showed scattered small bilateral mostly perifissural and subpleural lung nodules measuring up to 4 mm. Patient had no symptoms of shortness of breath cough unintentional weight loss or hemoptysis.  He was recommended for a follow-up 1 year CT chest.  This has been scheduled for Oct 30, 2023.  Patient was having snoring restless sleep and daytime sleepiness.  He was set up for a home sleep study through his primary care provider.  This showed moderate sleep apnea with AHI 25.2/hour and SpO2 low at 78%.  He was started on CPAP therapy last visit. Says he could not tolerate , tried different mask including nasal pillows. Could not tolerate, does not want to try again. Turning back into DME.  Wife did not like the noise.  Wants to discuss if he is a candidate for inspire device.  We went over the inspire device and workup process.  He would like to think about this and do some more research .       Allergies  Allergen Reactions   Morphine And Codeine Hives    Immunization History   Administered Date(s) Administered   Influenza-Unspecified 02/23/2023   PFIZER(Purple Top)SARS-COV-2 Vaccination 05/31/2019, 06/20/2019    Past Medical History:  Diagnosis Date   CVA (cerebral vascular accident) (HCC) 2018   Diabetes (HCC)    Diabetes mellitus, type II (HCC)    H. pylori infection    Hypercholesteremia    Hypertension     Tobacco History: Social History   Tobacco Use  Smoking Status Never  Smokeless Tobacco Never   Counseling given: Not Answered   Outpatient Medications Prior to Visit  Medication Sig Dispense Refill   Alprostadil (PROSTAGLANDIN E1) POWD 20 mcg/ml PGE. Inject 1 ml intracavernosal PRN; disp #5 ml, 3 refill 1 g 0   amLODipine  (NORVASC ) 10 MG tablet Take 1 tablet by mouth daily.     B-D UF III MINI PEN NEEDLES 31G X 5 MM MISC as directed to skin three times a day for 90 days     chlorthalidone  (HYGROTON ) 25 MG tablet Take 1 tablet (25 mg total) by mouth every morning. Hold until follow up with PCP     clotrimazole -betamethasone  (LOTRISONE ) cream Apply 1 Application topically 2 (two) times daily. 60 g 2   docusate sodium  (COLACE) 100 MG capsule Take 1 capsule (100 mg total) by mouth every 12 (twelve) hours. 60 capsule 0   ezetimibe  (ZETIA ) 10 MG tablet Take 10 mg by mouth daily.     insulin  aspart (NOVOLOG  FLEXPEN) 100 UNIT/ML FlexPen Inject 7 Units  into the skin 3 (three) times daily with meals. 15 mL 4   insulin  detemir (LEVEMIR ) 100 UNIT/ML FlexPen Inject 20 Units into the skin 2 (two) times daily. 15 mL 4   irbesartan  (AVAPRO ) 300 MG tablet Take 1 tablet (300 mg total) by mouth daily. Hold until follow up with PCP     isosorbide  mononitrate (IMDUR ) 30 MG 24 hr tablet Take 1 tablet (30 mg total) by mouth daily. 30 tablet 2   JARDIANCE 10 MG TABS tablet Take 10 mg by mouth daily.     metFORMIN (GLUCOPHAGE) 500 MG tablet Take 500 mg by mouth daily.     metoprolol  succinate (TOPROL -XL) 100 MG 24 hr tablet Take 1 tablet (100 mg total) by mouth  daily. Take with or immediately following a meal. 90 tablet 3   MOUNJARO 7.5 MG/0.5ML Pen Inject 7.5 mg into the skin once a week.     rosuvastatin  (CRESTOR ) 40 MG tablet Take 40 mg by mouth daily.     tamsulosin  (FLOMAX ) 0.4 MG CAPS capsule Take 0.4 mg by mouth at bedtime.     Dulaglutide (TRULICITY) 3 MG/0.5ML SOPN Inject 3 mLs into the skin once a week. Monday (Patient not taking: Reported on 10/09/2023)     No facility-administered medications prior to visit.     Review of Systems:   Constitutional:   No  weight loss, night sweats,  Fevers, chills, +fatigue, or  lassitude.  HEENT:   No headaches,  Difficulty swallowing,  Tooth/dental problems, or  Sore throat,                No sneezing, itching, ear ache, nasal congestion, post nasal drip,   CV:  No chest pain,  Orthopnea, PND, swelling in lower extremities, anasarca, dizziness, palpitations, syncope.   GI  No heartburn, indigestion, abdominal pain, nausea, vomiting, diarrhea, change in bowel habits, loss of appetite, bloody stools.   Resp: No shortness of breath with exertion or at rest.  No excess mucus, no productive cough,  No non-productive cough,  No coughing up of blood.  No change in color of mucus.  No wheezing.  No chest wall deformity  Skin: no rash or lesions.  GU: no dysuria, change in color of urine, no urgency or frequency.  No flank pain, no hematuria   MS:  No joint pain or swelling.  No decreased range of motion.  No back pain.    Physical Exam  BP (!) 170/90 (BP Location: Left Arm, Patient Position: Sitting, Cuff Size: Large)   Pulse 89   Temp 98.1 F (36.7 C) (Oral)   Ht 5\' 4"  (1.626 m)   Wt 196 lb 6.4 oz (89.1 kg)   SpO2 99%   BMI 33.71 kg/m   GEN: A/Ox3; pleasant , NAD, well nourished    HEENT:  [place]/AT,   NOSE-clear, THROAT-clear, no lesions, no postnasal drip or exudate noted.   NECK:  Supple w/ fair ROM; no JVD; normal carotid impulses w/o bruits; no thyromegaly or nodules palpated; no  lymphadenopathy.    RESP  Clear  P & A; w/o, wheezes/ rales/ or rhonchi. no accessory muscle use, no dullness to percussion  CARD:  RRR, no m/r/g, no peripheral edema, pulses intact, no cyanosis or clubbing.  GI:   Soft & nt; nml bowel sounds; no organomegaly or masses detected.   Musco: Warm bil, no deformities or joint swelling noted.   Neuro: alert, no focal deficits noted.    Skin: Warm, no lesions or  rashes    Lab Results:  CBC     BNP No results found for: "BNP"  ProBNP No results found for: "PROBNP"  Imaging:  Administration History     None           No data to display          No results found for: "NITRICOXIDE"      Assessment & Plan:   OSA (obstructive sleep apnea) Moderate obstructive sleep apnea-CPAP intolerant. Underlying HTN, HLD, CKD and DM . Patient education on potential complications of untreated OSA .  Discussed alteratives to CPAP -oral appliance , weight loss, positional sleep, INSPIRE device  He wants to look into INSPIRE device and get back to us .   Plan  Patient Instructions  CT chest without contrast  as planned next month   May return CPAP  Work on healthy weight loss Do not drive if sleepy  Sleep at head of bed elevated at 30 degree incline  Avoid sleeping on your back .  Can discuss Inspire device further at follow up   Follow up in 6 week  Dr. Gaynell Keeler or Arrington Bencomo NP and As needed  (30 min slot)      Lung nodule Small scattered lung nodules stable on CT chest 10/2022, follow up due next month . Follow up after to discuss results      Roena Clark, NP 10/09/2023

## 2023-10-09 NOTE — Assessment & Plan Note (Signed)
 Small scattered lung nodules stable on CT chest 10/2022, follow up due next month . Follow up after to discuss results

## 2023-10-09 NOTE — Assessment & Plan Note (Signed)
 Moderate obstructive sleep apnea-CPAP intolerant. Underlying HTN, HLD, CKD and DM . Patient education on potential complications of untreated OSA .  Discussed alteratives to CPAP -oral appliance , weight loss, positional sleep, INSPIRE device  He wants to look into INSPIRE device and get back to us .   Plan  Patient Instructions  CT chest without contrast  as planned next month   May return CPAP  Work on healthy weight loss Do not drive if sleepy  Sleep at head of bed elevated at 30 degree incline  Avoid sleeping on your back .  Can discuss Inspire device further at follow up   Follow up in 6 week  Dr. Gaynell Keeler or Damarious Holtsclaw NP and As needed  (30 min slot)

## 2023-10-12 ENCOUNTER — Ambulatory Visit: Admitting: Physician Assistant

## 2023-10-25 ENCOUNTER — Other Ambulatory Visit (HOSPITAL_COMMUNITY): Payer: Self-pay | Admitting: Family Medicine

## 2023-10-25 DIAGNOSIS — N289 Disorder of kidney and ureter, unspecified: Secondary | ICD-10-CM

## 2023-10-27 ENCOUNTER — Ambulatory Visit (HOSPITAL_COMMUNITY)

## 2023-10-30 ENCOUNTER — Ambulatory Visit (HOSPITAL_COMMUNITY)
Admission: RE | Admit: 2023-10-30 | Discharge: 2023-10-30 | Disposition: A | Discharge status: Home / Self Care | Source: Ambulatory Visit | Attending: Adult Health | Admitting: Adult Health

## 2023-10-30 DIAGNOSIS — R918 Other nonspecific abnormal finding of lung field: Secondary | ICD-10-CM | POA: Diagnosis present

## 2023-11-02 ENCOUNTER — Ambulatory Visit (HOSPITAL_COMMUNITY)

## 2023-11-04 IMAGING — CT CT ANGIO HEAD-NECK (W OR W/O PERF)
1 of 11 series · 6 of 33 positions shown · IV contrast (Omnipaque or Isovue)
Comparison: None available.

CLINICAL DATA: Initial evaluation for acute headache.

EXAM:
CT ANGIOGRAPHY HEAD AND NECK
TECHNIQUE: Multidetector CT imaging of the head and neck was performed using
the standard protocol during bolus administration of intravenous
contrast. Multiplanar CT image reconstructions and MIPs were
obtained to evaluate the vascular anatomy. Carotid stenosis
measurements (when applicable) are obtained utilizing NASCET
criteria, using the distal internal carotid diameter as the
denominator.

[Series 10: ax thins · axial · 0.39mm/px · z∈[-151,+116]mm · 6 of 378 slices shown]
[im 54/378  soft-tissue]
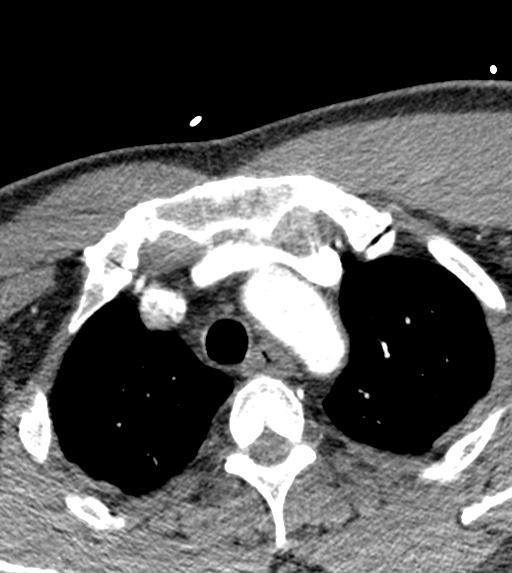
[im 108/378  bone]
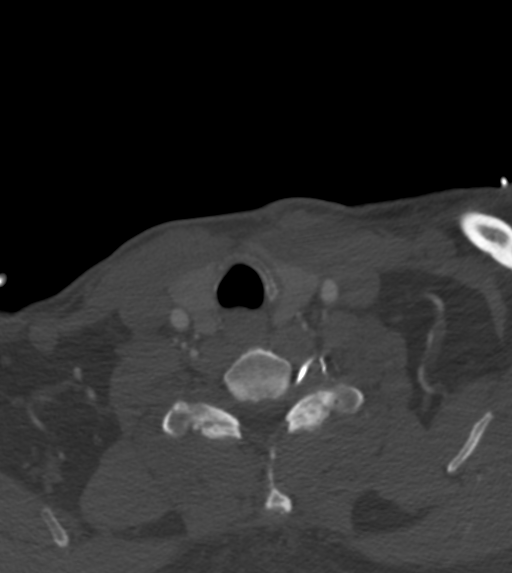
[im 162/378  soft-tissue]
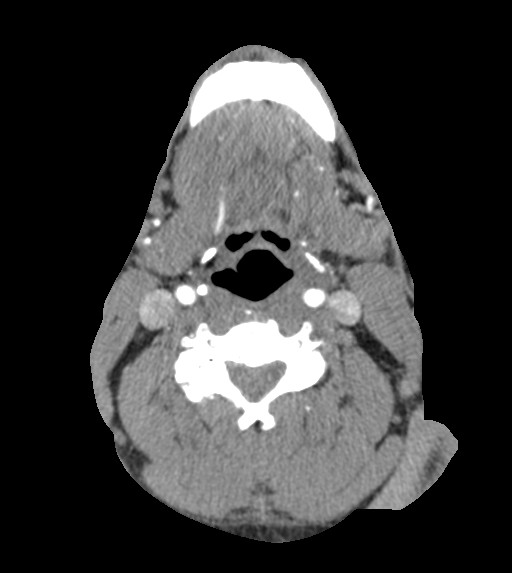
[im 216/378  bone]
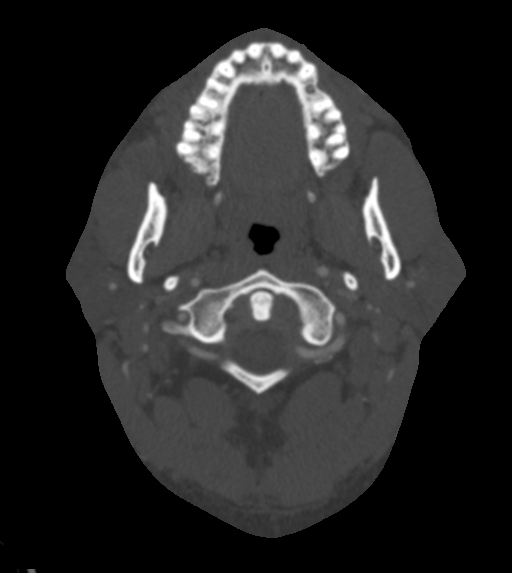
[im 270/378  soft-tissue]
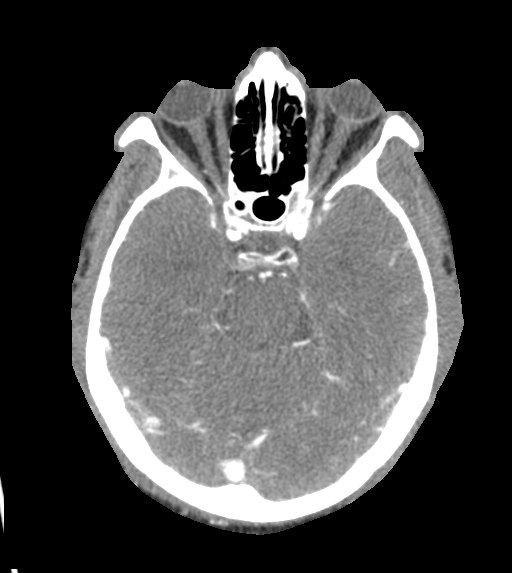
[im 324/378  bone]
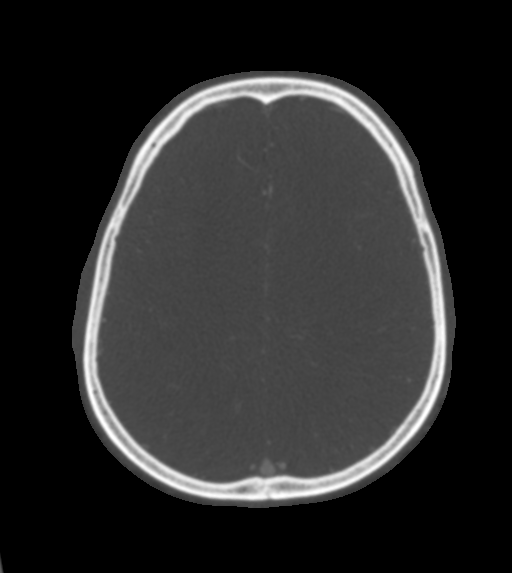

[6 of 33 positions shown; findings below may reference images not displayed]

RADIATION DOSE REDUCTION: This exam was performed according to the
departmental dose-optimization program which includes automated
exposure control, adjustment of the mA and/or kV according to
patient size and/or use of iterative reconstruction technique.

CONTRAST:  75mL OMNIPAQUE IOHEXOL 350 MG/ML SOLN
FINDINGS: CT HEAD FINDINGS

Brain: Cerebral volume within normal limits. Patchy hypodensity
involving the supratentorial cerebral white matter most consistent
with chronic small vessel ischemic disease. Area of encephalomalacia
involving the insula and subinsular region as well as the overlying
right frontal lobe, likely related to remote infarct.

No acute intracranial hemorrhage. No acute large vessel territory
infarct. No mass lesion or midline shift. No hydrocephalus or
extra-axial fluid collection.

Vascular: No hyperdense vessel.

Skull: Scalp soft tissues and calvarium within normal limits.

Sinuses: Clear.

Orbits: Globes and orbital soft tissues demonstrate no acute
finding.

Review of the MIP images confirms the above findings

CTA NECK FINDINGS

Aortic arch: Visualized aortic arch normal in caliber with normal
branch pattern. No stenosis about the origin the great vessels.

Right carotid system: Right common and internal carotid arteries
patent without stenosis or dissection. Mild eccentric calcified
plaque about the proximal cervical right ICA without stenosis.

Left carotid system: Left common and internal carotid arteries
patent without stenosis or dissection.

Vertebral arteries: Both vertebral arteries arise from the
subclavian arteries. No proximal subclavian artery stenosis.
Vertebral arteries patent without significant stenosis or
dissection.

Skeleton: No discrete or worrisome osseous lesions. Moderate to
advanced right-sided facet arthrosis noted within the cervical
spine.

Other neck: No other acute soft tissue abnormality within the neck.
Ossification of the stylohyoid ligaments bilaterally, which can be
seen in the setting of Bambucafe syndrome.

Upper chest: Visualized upper chest demonstrates no acute finding. 6
mm left upper lobe nodule (series 8, image 7), indeterminate.

Review of the MIP images confirms the above findings

CTA HEAD FINDINGS

Anterior circulation: Horizontal petrous left ICA widely patent.
Focal mild-to-moderate stenosis noted involving the horizontal
petrous right ICA. Atheromatous change throughout the carotid
siphons bilaterally, worse on the right. There is an associated
moderate to severe stenosis involving the proximal cavernous right
ICA (series 10, image 112). No significant stenosis about the left
siphon. A1 segments patent bilaterally. Normal anterior
communicating artery complex. Anterior cerebral arteries patent
without stenosis.

Left M1 diffusely irregular without hemodynamically significant
stenosis. Atheromatous irregularity seen about the left M1 segment
with associated mild to moderate multifocal narrowing (series 13,
image 16). Normal MCA bifurcations. No proximal MCA branch
occlusion. Distal small vessel atheromatous irregularity seen about
the MCA branches bilaterally.

Posterior circulation: Left V4 segment widely patent to the
vertebrobasilar junction. Short-segment mild stenosis noted within
the right vertebral artery as it crosses the dural reflection. Right
V4 segment otherwise patent. Neither PICA origin well visualized.
Basilar patent to its distal aspect without stenosis. Superior
cerebellar arteries patent bilaterally. Both PCA supplied via the
basilar as well as small bilateral posterior communicating arteries.
PCAs mildly irregular but patent to their distal aspects without
significant stenosis.

Venous sinuses: Patent allowing for timing the contrast bolus.

Anatomic variants: None significant.  No aneurysm.

Review of the MIP images confirms the above findings
IMPRESSION: CT HEAD IMPRESSION:

1. No acute intracranial abnormality.
2. Chronic encephalomalacia involving the right frontal lobe, likely
related to prior ischemic infarct.
3. Underlying moderate chronic microvascular ischemic disease.

CTA HEAD AND NECK IMPRESSION:

1. Negative CTA for large vessel occlusion or other acute vascular
abnormality. No aneurysm.
2. Moderate intracranial atherosclerotic disease. Most notable
findings include a short-segment moderate to severe stenosis
involving the cavernous right ICA, with mild to moderate multifocal
right M1 stenoses.
3. No hemodynamically significant stenosis within the neck.
4. 6 mm left upper lobe nodule, indeterminate. Non-contrast chest CT
at 6-12 months is recommended. If the nodule is stable at time of
repeat CT, then future CT at 18-24 months (from today's scan) is
considered optional for low-risk patients, but is recommended for
high-risk patients. This recommendation follows the consensus
statement: Guidelines for Management of Incidental Pulmonary Nodules
Detected on CT Images: From the [HOSPITAL] 0124; Radiology

## 2023-11-10 ENCOUNTER — Ambulatory Visit (HOSPITAL_COMMUNITY)

## 2023-11-14 ENCOUNTER — Ambulatory Visit: Payer: Self-pay | Admitting: Adult Health

## 2023-11-15 ENCOUNTER — Ambulatory Visit (HOSPITAL_COMMUNITY)
Admission: RE | Admit: 2023-11-15 | Discharge: 2023-11-15 | Disposition: A | Discharge status: Home / Self Care | Source: Ambulatory Visit | Attending: Family Medicine | Admitting: Family Medicine

## 2023-11-15 DIAGNOSIS — N289 Disorder of kidney and ureter, unspecified: Secondary | ICD-10-CM | POA: Diagnosis present

## 2023-11-20 ENCOUNTER — Ambulatory Visit: Admitting: Adult Health

## 2023-11-20 ENCOUNTER — Encounter: Payer: Self-pay | Admitting: Adult Health

## 2023-11-20 VITALS — BP 132/76 | HR 90 | Ht 71.0 in | Wt 186.6 lb

## 2023-11-20 DIAGNOSIS — G4733 Obstructive sleep apnea (adult) (pediatric): Secondary | ICD-10-CM | POA: Diagnosis not present

## 2023-11-20 DIAGNOSIS — R911 Solitary pulmonary nodule: Secondary | ICD-10-CM | POA: Diagnosis not present

## 2023-11-20 DIAGNOSIS — Z6826 Body mass index (BMI) 26.0-26.9, adult: Secondary | ICD-10-CM | POA: Insufficient documentation

## 2023-11-20 NOTE — Patient Instructions (Addendum)
 Work on healthy weight loss Do not drive if sleepy  Sleep at head of bed elevated at 30 degree incline  Avoid sleeping on your back .  Follow up with Cardiology as discussed.  Follow up in 1  year and As needed

## 2023-11-20 NOTE — Progress Notes (Signed)
 @Patient  ID: [name] [name], male    DOB: 1960-07-11, 63 y.o.   MRN: [id number]  Chief Complaint  Patient presents with   Follow-up    Referring provider: [facility]  HPI: 63 year old male never smoker seen for consult November 2024 for lung nodule and sleep apnea Medical history significant for hypertension, hyperlipidemia, chronic kidney disease, diabetes, stroke with intracranial hemorrhage Retired Theatre stage manager.   TEST/EVENTS :  Split-night sleep study August 08, 2022 AHI 25.2/hour SpO2 low 78%.   CT chest Oct 13, 2022 scattered small bilateral mostly perifissural and subpleural lung nodules largest measuring 4 mm left upper lobe nodule  11/20/2023 Follow up ; OSA and Lung nodule  Patient returns for a 27-month follow-up.  Previous CT chest May 2024 showed scattered small bilateral pulmonary nodules.  Patient had repeat CT chest 03/01/2024 that showed multiple sub-5 mm pulmonary nodules considered stable. Noted extensive coronary artery calcification.  Patient is a never smoker.  Denies any hemoptysis. He is retired Company secretary. Says these were first noted back in 2007 on chest xray.   Patient has moderate obstructive sleep apnea.  Patient was recommended to start on CPAP therapy.  Patient was unable to tolerate.  Patient turned his CPAP back into the DME company.  We discussed the inspire device last visit.  Patient does not want to go this route.  We discussed oral appliance and healthy weight loss, along with positional sleep Has been working on weight loss through diet and Mounjaro. Since weight loss has not been snoring . Spouse says it went away. Feels better and is  sleeping good.   Allergies  Allergen Reactions   Morphine And Codeine Hives    Immunization History  Administered Date(s) Administered   Influenza-Unspecified 02/23/2023   PFIZER(Purple Top)SARS-COV-2 Vaccination 05/31/2019, 06/20/2019    Past Medical History:  Diagnosis Date   CVA (cerebral  vascular accident) (HCC) 2018   Diabetes (HCC)    Diabetes mellitus, type II (HCC)    H. pylori infection    Hypercholesteremia    Hypertension     Tobacco History: Social History   Tobacco Use  Smoking Status Never  Smokeless Tobacco Never   Counseling given: Not Answered   Outpatient Medications Prior to Visit  Medication Sig Dispense Refill   Alprostadil (PROSTAGLANDIN E1) POWD 20 mcg/ml PGE. Inject 1 ml intracavernosal PRN; disp #5 ml, 3 refill 1 g 0   amLODipine  (NORVASC ) 10 MG tablet Take 1 tablet by mouth daily.     B-D UF III MINI PEN NEEDLES 31G X 5 MM MISC as directed to skin three times a day for 90 days     chlorthalidone  (HYGROTON ) 25 MG tablet Take 1 tablet (25 mg total) by mouth every morning. Hold until follow up with PCP     clotrimazole -betamethasone  (LOTRISONE ) cream Apply 1 Application topically 2 (two) times daily. 60 g 2   docusate sodium  (COLACE) 100 MG capsule Take 1 capsule (100 mg total) by mouth every 12 (twelve) hours. 60 capsule 0   ezetimibe  (ZETIA ) 10 MG tablet Take 10 mg by mouth daily.     insulin  aspart (NOVOLOG  FLEXPEN) 100 UNIT/ML FlexPen Inject 7 Units into the skin 3 (three) times daily with meals. 15 mL 4   insulin  detemir (LEVEMIR ) 100 UNIT/ML FlexPen Inject 20 Units into the skin 2 (two) times daily. 15 mL 4   irbesartan  (AVAPRO ) 300 MG tablet Take 1 tablet (300 mg total) by mouth daily. Hold until follow up with PCP  isosorbide  mononitrate (IMDUR ) 30 MG 24 hr tablet Take 1 tablet (30 mg total) by mouth daily. 30 tablet 2   JARDIANCE 10 MG TABS tablet Take 10 mg by mouth daily.     metFORMIN (GLUCOPHAGE) 500 MG tablet Take 500 mg by mouth daily.     metoprolol  succinate (TOPROL -XL) 100 MG 24 hr tablet Take 1 tablet (100 mg total) by mouth daily. Take with or immediately following a meal. 90 tablet 3   MOUNJARO 10 MG/0.5ML Pen Inject 10 mg into the skin once a week.     rosuvastatin  (CRESTOR ) 40 MG tablet Take 40 mg by mouth daily.      tamsulosin  (FLOMAX ) 0.4 MG CAPS capsule Take 0.4 mg by mouth at bedtime.     MOUNJARO 7.5 MG/0.5ML Pen Inject 7.5 mg into the skin once a week.     No facility-administered medications prior to visit.     Review of Systems:   Constitutional:   No  weight loss, night sweats,  Fevers, chills, fatigue, or  lassitude.  HEENT:   No headaches,  Difficulty swallowing,  Tooth/dental problems, or  Sore throat,                No sneezing, itching, ear ache, nasal congestion, post nasal drip,   CV:  No chest pain,  Orthopnea, PND, swelling in lower extremities, anasarca, dizziness, palpitations, syncope.   GI  No heartburn, indigestion, abdominal pain, nausea, vomiting, diarrhea, change in bowel habits, loss of appetite, bloody stools.   Resp: No shortness of breath with exertion or at rest.  No excess mucus, no productive cough,  No non-productive cough,  No coughing up of blood.  No change in color of mucus.  No wheezing.  No chest wall deformity  Skin: no rash or lesions.  GU: no dysuria, change in color of urine, no urgency or frequency.  No flank pain, no hematuria   MS:  No joint pain or swelling.  No decreased range of motion.  No back pain.    Physical Exam  BP 132/76 (BP Location: Left Arm, Patient Position: Sitting, Cuff Size: Normal)   Pulse 90   Ht 5\' 11"  (1.803 m)   Wt 186 lb 9.6 oz (84.6 kg)   SpO2 99%   BMI 26.03 kg/m   GEN: A/Ox3; pleasant , NAD, well nourished    HEENT:  [place]/AT,   NOSE-clear, THROAT-clear, no lesions, no postnasal drip or exudate noted.   NECK:  Supple w/ fair ROM; no JVD; normal carotid impulses w/o bruits; no thyromegaly or nodules palpated; no lymphadenopathy.    RESP  Clear  P & A; w/o, wheezes/ rales/ or rhonchi. no accessory muscle use, no dullness to percussion  CARD:  RRR, no m/r/g, no peripheral edema, pulses intact, no cyanosis or clubbing.  GI:   Soft & nt; nml bowel sounds; no organomegaly or masses detected.   Musco: Warm bil, no  deformities or joint swelling noted.   Neuro: alert, no focal deficits noted.    Skin: Warm, no lesions or rashes    Lab Results:    BNP No results found for: "BNP"  ProBNP No results found for: "PROBNP"  Imaging: CT Chest Wo Contrast Result Date: 11/11/2023 CLINICAL DATA:  Multiple pulmonary nodules, follow-up examination EXAM: CT CHEST WITHOUT CONTRAST TECHNIQUE: Multidetector CT imaging of the chest was performed following the standard protocol without IV contrast. RADIATION DOSE REDUCTION: This exam was performed according to the departmental dose-optimization program which includes automated  exposure control, adjustment of the mA and/or kV according to patient size and/or use of iterative reconstruction technique. COMPARISON:  10/13/2022 FINDINGS: Cardiovascular: Extensive multi-vessel coronary artery calcification. Global cardiac size within normal limits. No pericardial effusion. Central pulmonary arteries are of normal caliber. Mild atherosclerotic calcification within the thoracic aorta. No aortic aneurysm. Mediastinum/Nodes: No enlarged mediastinal or axillary lymph nodes. Thyroid  gland, trachea, and esophagus demonstrate no significant findings. Lungs/Pleura: Multiple sub 5 mm pulmonary nodules are again seen bilaterally which are stable in size number and safely considered benign given their stability over time. No focal pulmonary infiltrate. No pneumothorax or pleural effusion Upper Abdomen: No acute abnormality. Musculoskeletal: No chest wall mass or suspicious bone lesions identified. IMPRESSION: 1. Multiple sub 5 mm pulmonary nodules are again seen bilaterally which are stable in size and number and safely considered benign given their stability over time. No further follow-up is required. 2. Extensive multi-vessel coronary artery calcification. Aortic Atherosclerosis (ICD10-I70.0). Electronically Signed   By: Worthy Heads M.D.   On: 11/11/2023 21:14    Administration History      None           No data to display          No results found for: "NITRICOXIDE"      Assessment & Plan:   OSA (obstructive sleep apnea) Moderate OSA- CPAP intolerant. Not interested in INSPIRE.  Is doing well with weight loss with improved sleep hygiene and resolution of reported snoring. Wants to hold off oral appliance referral. Going forward can repeat sleep study to verify improved in Sleep score/AHI. He does have multiple co-morbidities that place him at risk for potential side effects of untreated sleep apnea   Plan  Patient Instructions  Work on healthy weight loss Do not drive if sleepy  Sleep at head of bed elevated at 30 degree incline  Avoid sleeping on your back .  Follow up with Cardiology as discussed.  Follow up in 1  year and As needed      Lung nodule Small scattered lung nodule stable on repeat serial CT chest 10/2023. Never smoker - no further imaging indicated at this time.  Incidental finding of Atherosclerosis - advised to discuss with cardiology.   BMI 26.0-26.9,adult Continue with healthy weight loss      Roena Clark, NP 11/20/2023

## 2023-11-20 NOTE — Assessment & Plan Note (Signed)
Continue with healthy weight loss 

## 2023-11-20 NOTE — Assessment & Plan Note (Signed)
 Small scattered lung nodule stable on repeat serial CT chest 10/2023. Never smoker - no further imaging indicated at this time.  Incidental finding of Atherosclerosis - advised to discuss with cardiology.

## 2023-11-20 NOTE — Assessment & Plan Note (Signed)
 Moderate OSA- CPAP intolerant. Not interested in INSPIRE.  Is doing well with weight loss with improved sleep hygiene and resolution of reported snoring. Wants to hold off oral appliance referral. Going forward can repeat sleep study to verify improved in Sleep score/AHI. He does have multiple co-morbidities that place him at risk for potential side effects of untreated sleep apnea   Plan  Patient Instructions  Work on healthy weight loss Do not drive if sleepy  Sleep at head of bed elevated at 30 degree incline  Avoid sleeping on your back .  Follow up with Cardiology as discussed.  Follow up in 1  year and As needed

## 2023-11-24 ENCOUNTER — Ambulatory Visit

## 2023-11-24 VITALS — BP 134/78 | HR 85 | Ht 71.0 in | Wt 184.0 lb

## 2023-11-24 DIAGNOSIS — I10 Essential (primary) hypertension: Secondary | ICD-10-CM | POA: Diagnosis not present

## 2023-11-24 DIAGNOSIS — E782 Mixed hyperlipidemia: Secondary | ICD-10-CM | POA: Diagnosis not present

## 2023-11-24 DIAGNOSIS — E538 Deficiency of other specified B group vitamins: Secondary | ICD-10-CM

## 2023-11-24 DIAGNOSIS — Z7984 Long term (current) use of oral hypoglycemic drugs: Secondary | ICD-10-CM

## 2023-11-24 DIAGNOSIS — E559 Vitamin D deficiency, unspecified: Secondary | ICD-10-CM

## 2023-11-24 DIAGNOSIS — E1159 Type 2 diabetes mellitus with other circulatory complications: Secondary | ICD-10-CM

## 2023-11-24 MED ORDER — FREESTYLE LIBRE 3 SENSOR MISC
1.0000 [IU] | 2 refills | Status: AC
Start: 2023-11-24 — End: ?

## 2023-11-24 MED ORDER — AMLODIPINE BESYLATE 10 MG PO TABS: 10.0000 mg | ORAL_TABLET | Freq: Every day | ORAL | 1 refills | Status: AC

## 2023-11-24 MED ORDER — METFORMIN HCL 500 MG PO TABS: 500.0000 mg | ORAL_TABLET | Freq: Two times a day (BID) | ORAL | Status: DC

## 2023-11-24 NOTE — Assessment & Plan Note (Signed)
 Stable with current medications.  Continue with amlodipine , chlorthalidone  on and metoprolol . He has an appointment with cardiology next month. Continue with heart healthy diet and regular exercise. Recommend follow-up in 3 to 6 months or sooner if needed.

## 2023-11-24 NOTE — Assessment & Plan Note (Signed)
 Unfortunately, his previous PCP is not accessible through epic system.  We will recheck labs today.  Order for CGM sent to pharmacy. -Continue modified carbohydrate diet. -Continue Levemir  twice a day, weekly Trulicity injection and 3 times daily NovoLog  for meal coverage. Recommend follow-up in 3 months or sooner if labs indicate.

## 2023-11-24 NOTE — Assessment & Plan Note (Signed)
 Recheck levels.  He reports taking daily oral B12 supplement.  Follow-up according to lab results.

## 2023-11-24 NOTE — Assessment & Plan Note (Signed)
 He states that he is unable to tolerate statins or Zetia .  He states cardiology mentioned Repatha injection at last visit but he never heard any more about this.  Recommend discussing at upcoming appointment with cardiology.

## 2023-11-24 NOTE — Patient Instructions (Signed)
 Recommend Eucerin cream with urea for feet.   Follow-up in 3 months.

## 2023-11-24 NOTE — Assessment & Plan Note (Signed)
 He reports taking daily vitamin D supplement.  Will recheck levels today.  Follow-up according to lab results.

## 2023-11-24 NOTE — Progress Notes (Signed)
 New Patient Office Visit  Subjective    Patient ID: [name] [name], male    DOB: Jun 10, 1961  Age: 63 y.o. MRN: [id number]  CC:  Chief Complaint  Patient presents with   Establish Care    Pt states No dr has been able to get his bp under control    Patient is here to establish care.  He has multiple chronic health conditions Carotid artery disease/visual changes/diabetic retinopathy/prior stroke - Will check carotid Dopplers, requested by ophthalmology.   Hyperlipidemia - Baseline LDL 170.  Did not tolerate Zetia  or Crestor  40 mg.  We will send a lipid clinic.  Repatha would be excellent for him.  LDL goal less than 55 with stroke.   Hypertension - Challenging to control.  He was seeing nephrologist here in [place] He has transitioned him off of the irbesartan  300 mg because of creatinine of 1.9 and he is now on isosorbide  30 mg as well as chlorthalidone  25 mg amlodipine  10 mg Toprol  100 mg.   Prior stroke - Continue with aspirin    Diabetes - Insulin , Trulicity  HPI [name] [name] presents to establish care   Outpatient Encounter Medications as of 11/24/2023  Medication Sig   chlorthalidone  (HYGROTON ) 25 MG tablet Take 1 tablet (25 mg total) by mouth every morning. Hold until follow up with PCP   Continuous Glucose Sensor (FREESTYLE LIBRE 3 SENSOR) MISC 1 Units by Subdermal route every 14 (fourteen) days. Use it to check blood glucose as instructed.   insulin  aspart (NOVOLOG  FLEXPEN) 100 UNIT/ML FlexPen Inject 7 Units into the skin 3 (three) times daily with meals.   insulin  detemir (LEVEMIR ) 100 UNIT/ML FlexPen Inject 20 Units into the skin 2 (two) times daily.   metoprolol  succinate (TOPROL -XL) 100 MG 24 hr tablet Take 1 tablet (100 mg total) by mouth daily. Take with or immediately following a meal.   [DISCONTINUED] amLODipine  (NORVASC ) 10 MG tablet Take 1 tablet by mouth daily.   [DISCONTINUED] metFORMIN (GLUCOPHAGE) 500 MG tablet Take 500 mg by mouth daily.    [DISCONTINUED] MOUNJARO 10 MG/0.5ML Pen Inject 10 mg into the skin once a week.   [DISCONTINUED] rosuvastatin  (CRESTOR ) 40 MG tablet Take 40 mg by mouth daily.   amLODipine  (NORVASC ) 10 MG tablet Take 1 tablet (10 mg total) by mouth daily.   B-D UF III MINI PEN NEEDLES 31G X 5 MM MISC as directed to skin three times a day for 90 days   metFORMIN (GLUCOPHAGE) 500 MG tablet Take 1 tablet (500 mg total) by mouth 2 (two) times daily with a meal.   [DISCONTINUED] Alprostadil (PROSTAGLANDIN E1) POWD 20 mcg/ml PGE. Inject 1 ml intracavernosal PRN; disp #5 ml, 3 refill (Patient not taking: Reported on 11/24/2023)   [DISCONTINUED] clotrimazole -betamethasone  (LOTRISONE ) cream Apply 1 Application topically 2 (two) times daily. (Patient not taking: Reported on 11/24/2023)   [DISCONTINUED] docusate sodium  (COLACE) 100 MG capsule Take 1 capsule (100 mg total) by mouth every 12 (twelve) hours. (Patient not taking: Reported on 11/24/2023)   [DISCONTINUED] ezetimibe  (ZETIA ) 10 MG tablet Take 10 mg by mouth daily. (Patient not taking: Reported on 11/24/2023)   [DISCONTINUED] irbesartan  (AVAPRO ) 300 MG tablet Take 1 tablet (300 mg total) by mouth daily. Hold until follow up with PCP (Patient not taking: Reported on 11/24/2023)   [DISCONTINUED] isosorbide  mononitrate (IMDUR ) 30 MG 24 hr tablet Take 1 tablet (30 mg total) by mouth daily. (Patient not taking: Reported on 11/24/2023)   [DISCONTINUED] JARDIANCE 10 MG TABS tablet Take 10  mg by mouth daily. (Patient not taking: Reported on 11/24/2023)   [DISCONTINUED] tamsulosin  (FLOMAX ) 0.4 MG CAPS capsule Take 0.4 mg by mouth at bedtime. (Patient not taking: Reported on 11/24/2023)   No facility-administered encounter medications on file as of 11/24/2023.    Past Medical History:  Diagnosis Date   CVA (cerebral vascular accident) (HCC) 2018   Diabetes (HCC)    Diabetes mellitus, type II (HCC)    H. pylori infection    Hypercholesteremia    Hypertension     Past Surgical  History:  Procedure Laterality Date   EYE SURGERY Left    retina [facility] at [facility]   VASECTOMY  1996    Family History  Problem Relation Age of Onset   Diabetes Mother    Colon cancer Neg Hx    Stomach cancer Neg Hx    Rectal cancer Neg Hx    Esophageal cancer Neg Hx     Social History   Socioeconomic History   Marital status: Married    Spouse name: Not on file   Number of children: Not on file   Years of education: Not on file   Highest education level: Not on file  Occupational History   Not on file  Tobacco Use   Smoking status: Never   Smokeless tobacco: Never  Vaping Use   Vaping status: Never Used  Substance and Sexual Activity   Alcohol use: Not Currently    Comment: occasionally   Drug use: Never   Sexual activity: Yes  Other Topics Concern   Not on file  Social History Narrative   Not on file   Social Drivers of Health   Financial Resource Strain: Not on file  Food Insecurity: No Food Insecurity (04/20/2022)   Hunger Vital Sign    Worried About Running Out of Food in the Last Year: Never true    Ran Out of Food in the Last Year: Never true  Transportation Needs: No Transportation Needs (04/20/2022)   PRAPARE - Administrator, Civil Service (Medical): No    Lack of Transportation (Non-Medical): No  Physical Activity: Not on file  Stress: Not on file  Social Connections: Not on file  Intimate Partner Violence: Not At Risk (04/20/2022)   Humiliation, Afraid, Rape, and Kick questionnaire    Fear of Current or Ex-Partner: No    Emotionally Abused: No    Physically Abused: No    Sexually Abused: No    ROS      Objective    BP 134/78 (BP Location: Left Arm, Patient Position: Sitting, Cuff Size: Normal)   Pulse 85   Ht 5' 11 (1.803 m)   Wt 184 lb (83.5 kg)   SpO2 99%   BMI 25.66 kg/m   Physical Exam Vitals and nursing note reviewed.  Constitutional:      Appearance: Normal appearance.  HENT:     Head:  Normocephalic.     Right Ear: Tympanic membrane, ear canal and external ear normal.     Left Ear: Tympanic membrane, ear canal and external ear normal.     Nose: Nose normal.     Mouth/Throat:     Mouth: Mucous membranes are moist.     Pharynx: Oropharynx is clear.   Cardiovascular:     Rate and Rhythm: Normal rate and regular rhythm.  Pulmonary:     Effort: Pulmonary effort is normal.     Breath sounds: Normal breath sounds.   Musculoskeletal:  Cervical back: Normal range of motion and neck supple.   Skin:    General: Skin is warm and dry.   Neurological:     Mental Status: He is alert and oriented to person, place, and time.   Psychiatric:        Mood and Affect: Mood normal.        Thought Content: Thought content normal.         Assessment & Plan:   Problem List Items Addressed This Visit       Cardiovascular and Mediastinum   DM type 2 causing vascular disease (HCC) - Primary   Unfortunately, his previous PCP is not accessible through epic system.  We will recheck labs today.  Order for CGM sent to pharmacy. -Continue modified carbohydrate diet. -Continue Levemir  twice a day, weekly Trulicity injection and 3 times daily NovoLog  for meal coverage. Recommend follow-up in 3 months or sooner if labs indicate.      Relevant Medications   metFORMIN (GLUCOPHAGE) 500 MG tablet   amLODipine  (NORVASC ) 10 MG tablet   Continuous Glucose Sensor (FREESTYLE LIBRE 3 SENSOR) MISC   Other Relevant Orders   HgB A1c   CMP14+EGFR   Urine Microalbumin w/creat. ratio   Primary hypertension   Stable with current medications.  Continue with amlodipine , chlorthalidone  on and metoprolol . He has an appointment with cardiology next month. Continue with heart healthy diet and regular exercise. Recommend follow-up in 3 to 6 months or sooner if needed.      Relevant Medications   amLODipine  (NORVASC ) 10 MG tablet   Other Relevant Orders   CMP14+EGFR     Other   Mixed  hyperlipidemia   He states that he is unable to tolerate statins or Zetia .  He states cardiology mentioned Repatha injection at last visit but he never heard any more about this.  Recommend discussing at upcoming appointment with cardiology.        Relevant Medications   amLODipine  (NORVASC ) 10 MG tablet   Other Relevant Orders   Lipid Profile   B12 deficiency   Recheck levels.  He reports taking daily oral B12 supplement.  Follow-up according to lab results.      Relevant Orders   B12   Vitamin D deficiency   He reports taking daily vitamin D supplement.  Will recheck levels today.  Follow-up according to lab results.      Relevant Orders   Vitamin D (25 hydroxy)    Return in about 3 months (around 02/24/2024).   Alison Irvine, FNP

## 2023-11-27 ENCOUNTER — Ambulatory Visit: Admitting: Urology

## 2023-11-28 LAB — CMP14+EGFR
ALT: 20 IU/L (ref 0–44)
AST: 15 IU/L (ref 0–40)
Albumin: 4.5 g/dL (ref 3.8–4.9)
Alkaline Phosphatase: 84 IU/L (ref 44–121)
BUN/Creatinine Ratio: 13 (ref 10–24)
BUN: 24 mg/dL (ref 8–27)
Bilirubin Total: 0.8 mg/dL (ref 0.0–1.2)
CO2: 23 mmol/L (ref 20–29)
Calcium: 9.7 mg/dL (ref 8.6–10.2)
Chloride: 100 mmol/L (ref 96–106)
Creatinine, Ser: 1.91 mg/dL — ABNORMAL HIGH (ref 0.76–1.27)
Globulin, Total: 3 g/dL (ref 1.5–4.5)
Glucose: 181 mg/dL — ABNORMAL HIGH (ref 70–99)
Potassium: 4.7 mmol/L (ref 3.5–5.2)
Sodium: 137 mmol/L (ref 134–144)
Total Protein: 7.5 g/dL (ref 6.0–8.5)
eGFR: 40 mL/min/{1.73_m2} — ABNORMAL LOW (ref >59)

## 2023-11-28 LAB — LIPID PANEL
Chol/HDL Ratio: 4.6 ratio (ref 0.0–5.0)
Cholesterol, Total: 228 mg/dL — ABNORMAL HIGH (ref 100–199)
HDL: 50 mg/dL (ref >39)
LDL Chol Calc (NIH): 144 mg/dL — ABNORMAL HIGH (ref 0–99)
Triglycerides: 187 mg/dL — ABNORMAL HIGH (ref 0–149)
VLDL Cholesterol Cal: 34 mg/dL (ref 5–40)

## 2023-11-28 LAB — MICROALBUMIN / CREATININE URINE RATIO

## 2023-11-28 LAB — VITAMIN B12: Vitamin B-12: 459 pg/mL (ref 232–1245)

## 2023-11-28 LAB — VITAMIN D 25 HYDROXY (VIT D DEFICIENCY, FRACTURES): Vit D, 25-Hydroxy: 21.2 ng/mL — ABNORMAL LOW (ref 30.0–100.0)

## 2023-11-28 LAB — SPECIMEN STATUS REPORT

## 2023-11-28 LAB — HEMOGLOBIN A1C
Est. average glucose Bld gHb Est-mCnc: 226 mg/dL
Hgb A1c MFr Bld: 9.5 % — ABNORMAL HIGH (ref 4.8–5.6)

## 2023-12-21 ENCOUNTER — Ambulatory Visit: Payer: Self-pay

## 2024-01-04 ENCOUNTER — Telehealth: Payer: Self-pay | Admitting: Pharmacy Technician

## 2024-01-04 ENCOUNTER — Encounter: Payer: Self-pay | Admitting: Physician Assistant

## 2024-01-04 ENCOUNTER — Telehealth: Payer: Self-pay | Admitting: Pharmacist

## 2024-01-04 ENCOUNTER — Other Ambulatory Visit (HOSPITAL_COMMUNITY): Payer: Self-pay

## 2024-01-04 ENCOUNTER — Ambulatory Visit: Attending: Physician Assistant | Admitting: Physician Assistant

## 2024-01-04 VITALS — BP 160/84 | HR 87 | Ht 69.0 in | Wt 190.0 lb

## 2024-01-04 DIAGNOSIS — I2510 Atherosclerotic heart disease of native coronary artery without angina pectoris: Secondary | ICD-10-CM | POA: Diagnosis not present

## 2024-01-04 DIAGNOSIS — I10 Essential (primary) hypertension: Secondary | ICD-10-CM | POA: Diagnosis not present

## 2024-01-04 DIAGNOSIS — R9431 Abnormal electrocardiogram [ECG] [EKG]: Secondary | ICD-10-CM

## 2024-01-04 DIAGNOSIS — E782 Mixed hyperlipidemia: Secondary | ICD-10-CM | POA: Diagnosis not present

## 2024-01-04 MED ORDER — REPATHA SURECLICK 140 MG/ML ~~LOC~~ SOAJ: 140.0000 mg | SUBCUTANEOUS | 3 refills | Status: AC

## 2024-01-04 MED ORDER — CARVEDILOL 25 MG PO TABS: 25.0000 mg | ORAL_TABLET | Freq: Two times a day (BID) | ORAL | 3 refills | Status: AC

## 2024-01-04 NOTE — Patient Instructions (Addendum)
 Medication Instructions:  Stop Metoprolol  Succinate as discussed Start Carvedilol  25 mg twice daily  *If you need a refill on your cardiac medications before your next appointment, please call your pharmacy*  Lab Work: NONE ordered at this time of appointment   Testing/Procedures: Your physician has requested that you have a myoview (Treadmill Test). For further information please visit [email or web address] Please follow instruction sheet, as given.   Follow-Up: At [facility], you and your health needs are our priority.  As part of our continuing mission to provide you with exceptional heart care, our providers are all part of one team.  This team includes your primary Cardiologist (physician) and Advanced Practice Providers or APPs (Physician Assistants and Nurse Practitioners) who all work together to provide you with the care you need, when you need it.  Your next appointment:   6 week(s)  Provider:   Scot Ford, PA-C          We recommend signing up for the patient portal called MyChart.  Sign up information is provided on this After Visit Summary.  MyChart is used to connect with patients for Virtual Visits (Telemedicine).  Patients are able to view lab/test results, encounter notes, upcoming appointments, etc.  Non-urgent messages can be sent to your provider as well.   To learn more about what you can do with MyChart, go to [email or web address].   Other Instructions Hold Carvedilol  the day of your Treadmill test. No caffeinated drinks 12 hours prior to test. How to Prepare for Your Myoview Test (stress test):  1.Please do not take these medications before your test:  (please note if this is an exercise test pt should hold beta blocker prior) 2.Your remaining medications may be taken with water. 3.Nothing to eat or drink, except water, 4 hours prior to arrival time.  NO caffeine/decaffeinated products, or chocolate 12 hours prior to arrival. 4.Ladies, please do not  wear dresses.  Skirts or pants are approprate, please wear a short sleeve shirt. 5.NO perfume, cologne or lotion 6.Wear comfortable walking shoes.  NO HEELS! 7.Total time is 3 to 4 hours; you may want to bring reading material for the waiting time. 8.Please report to [facility] for your test  What to expect after you arrive:  Once you arrive and check in for your appointment an IV will be started in your arm.  Then the Technoligist will inject a small amount of radioactive tracer.  There will be a 1 hour waiting period after this injection.  A series of pictures will be taken of your heart following this waiting period.  You will be prepped for the stress portion of the test.  During the stress portion of your test you will either walk on a treadmill or receive a small, safe amount of radioactive tracer injected in your IV.  After the stress portion, there is a short rest period during which time your heart and blood pressure will be monitored.  After the short rest period the Technologist will begin your second set of pictures.  Your doctor will inform you of your test results within 7-10 business days.  In preparation for your appointment, medication and supplies will be purchased.  Appointment availability is limited, so if you need to cancel or reschedule please call the office at [phone] 24 hours in advance to avoid a cancellation fee of $100.00

## 2024-01-04 NOTE — Progress Notes (Unsigned)
 Cardiology Office Note   Date:  01/05/2024  ID:  [name] [name], [name] [name] April 15, 1961, MRN [id number] PCP: Bevely Doffing, FNP  [facility] Providers Cardiologist:  Oneil Parchment, MD     History of Present Illness [name] [name] is a 63 y.o. male with past medical history of hypertension, hyperlipidemia intolerant of statins, DM2, CKD and history of CVA.  Echocardiogram obtained on 02/17/2022 showed EF 50 to 55%, mild LVH, grade 1 DD, normal RV, trivial MR.  Patient was last seen by Dr. Parchment in September 2024 at which time he was doing well.  Carotid Doppler obtained in October 2024 showed minimal plaque.  Due to statin intolerance, patient was referred to the lipid clinic.  Prior to the visit, it was reported his nephrologist has stopped his Zetia  due to renal function decline.  He was on 40 mg daily of rosuvastatin .  It was recommended for the patient to start on PCSK9 inhibitor, prior authorization was initiated, however it does not appear patient ever started on the injectable medication.  He was admitted at [facility] in November 2024 due to left retinal detachment.  CT of the chest obtained on 11/11/2023 showed extensive multivessel coronary artery calcification, multiple sub-5 mm pulmonary nodule seen bilaterally, they were considered benign given their stability over time.  Patient presents today for cardiology follow-up.  He is concerned about the multivessel coronary artery calcification that was seen.  He is still able to  walk about 3 miles per day.  He has had multiple stress test in the past, however has not done one in several years.  His EKG is abnormal with inferolateral T wave inversion.  I recommended a treadmill nuclear stress test to make sure he does not have significant coronary artery disease.  Blood pressure remains elevated.  He is on metoprolol  succinate 100 mg daily, irbesartan  75 mg daily, chlorthalidone  50 mg daily and amlodipine  10 mg daily.  I will switch metoprolol  succinate to  carvedilol  25 mg twice a day for better blood pressure control.  I have messaged our clinical pharmacist who will work on Repatha  prior authorization.  I will see him back in 6 weeks for blood pressure medication titration  ROS:   He denies chest pain, palpitations, dyspnea, pnd, orthopnea, n, v, dizziness, syncope, edema, weight gain, or early satiety. All other systems reviewed and are otherwise negative except as noted above.    Studies Reviewed      Cardiac Studies & Procedures   ______________________________________________________________________________________________     ECHOCARDIOGRAM  ECHOCARDIOGRAM COMPLETE 02/17/2022  Narrative ECHOCARDIOGRAM REPORT    Patient Name:   [name] [name] Date of Exam: 02/17/2022 Medical Rec #:  [id number]     Height:       70.0 in Accession #:    [id number]    Weight:       203.0 lb Date of Birth:  [birth year]      BSA:          2.101 m Patient Age:    61 years      BP:           176/94 mmHg Patient Gender: M             HR:           90 bpm. Exam Location:  [facility]  Procedure: 2D Echo, Cardiac Doppler and Color Doppler  Indications:    Abnormal ECG R94.31  History:        Patient has no prior history of  Echocardiogram examinations. Stroke; Risk Factors:Hypertension, Diabetes and Dyslipidemia. Hx of COVID-19.  Sonographer:    Aida Pizza RCS Referring Phys: [phone] MARK C SKAINS  IMPRESSIONS   1. Left ventricular ejection fraction, by estimation, is 50 to 55%. The left ventricle has low normal function. The left ventricle has no regional wall motion abnormalities. There is mild left ventricular hypertrophy. Left ventricular diastolic parameters are consistent with Grade I diastolic dysfunction (impaired relaxation). 2. Right ventricular systolic function is normal. The right ventricular size is normal. Tricuspid regurgitation signal is inadequate for assessing PA pressure. 3. The mitral valve is normal in structure. Trivial mitral  valve regurgitation. No evidence of mitral stenosis. 4. The tricuspid valve is abnormal. 5. The aortic valve is tricuspid. Aortic valve regurgitation is not visualized. No aortic stenosis is present.  FINDINGS Left Ventricle: Left ventricular ejection fraction, by estimation, is 50 to 55%. The left ventricle has low normal function. The left ventricle has no regional wall motion abnormalities. The left ventricular internal cavity size was normal in size. There is mild left ventricular hypertrophy. Left ventricular diastolic parameters are consistent with Grade I diastolic dysfunction (impaired relaxation). Normal left ventricular filling pressure.  Right Ventricle: The right ventricular size is normal. Right vetricular wall thickness was not well visualized. Right ventricular systolic function is normal. Tricuspid regurgitation signal is inadequate for assessing PA pressure.  Left Atrium: Left atrial size was normal in size.  Right Atrium: Right atrial size was normal in size.  Pericardium: There is no evidence of pericardial effusion.  Mitral Valve: The mitral valve is normal in structure. Trivial mitral valve regurgitation. No evidence of mitral valve stenosis.  Tricuspid Valve: The tricuspid valve is abnormal. Tricuspid valve regurgitation is mild . No evidence of tricuspid stenosis.  Aortic Valve: The aortic valve is tricuspid. Aortic valve regurgitation is not visualized. No aortic stenosis is present. Aortic valve mean gradient measures 3.6 mmHg. Aortic valve peak gradient measures 6.8 mmHg. Aortic valve area, by VTI measures 2.02 cm.  Pulmonic Valve: The pulmonic valve was not well visualized. Pulmonic valve regurgitation is not visualized. No evidence of pulmonic stenosis.  Aorta: The aortic root is normal in size and structure.  Venous: The inferior vena cava was not well visualized.  IAS/Shunts: No atrial level shunt detected by color flow Doppler.   LEFT VENTRICLE PLAX  2D LVIDd:         4.00 cm      Diastology LVIDs:         3.10 cm      LV e' medial:    5.55 cm/s LV PW:         1.10 cm      LV E/e' medial:  11.8 LV IVS:        1.10 cm      LV e' lateral:   8.70 cm/s LVOT diam:     1.90 cm      LV E/e' lateral: 7.5 LV SV:         49 LV SV Index:   23 LVOT Area:     2.84 cm  LV Volumes (MOD) LV vol d, MOD A2C: 84.9 ml LV vol d, MOD A4C: 110.0 ml LV vol s, MOD A2C: 39.4 ml LV vol s, MOD A4C: 47.8 ml LV SV MOD A2C:     45.5 ml LV SV MOD A4C:     110.0 ml LV SV MOD BP:      51.3 ml  RIGHT VENTRICLE RV S prime:  9.79 cm/s TAPSE (M-mode): 1.5 cm  LEFT ATRIUM             Index        RIGHT ATRIUM           Index LA diam:        4.00 cm 1.90 cm/m   RA Area:     16.10 cm LA Vol (A2C):   60.3 ml 28.71 ml/m  RA Volume:   41.80 ml  19.90 ml/m LA Vol (A4C):   38.2 ml 18.19 ml/m LA Biplane Vol: 50.3 ml 23.95 ml/m AORTIC VALVE AV Area (Vmax):    2.04 cm AV Area (Vmean):   2.00 cm AV Area (VTI):     2.02 cm AV Vmax:           130.31 cm/s AV Vmean:          88.785 cm/s AV VTI:            0.244 m AV Peak Grad:      6.8 mmHg AV Mean Grad:      3.6 mmHg LVOT Vmax:         93.57 cm/s LVOT Vmean:        62.567 cm/s LVOT VTI:          0.174 m LVOT/AV VTI ratio: 0.71  AORTA Ao Root diam: 3.60 cm  MITRAL VALVE MV Area (PHT): 2.76 cm    SHUNTS MV Decel Time: 275 msec    Systemic VTI:  0.17 m MV E velocity: 65.60 cm/s  Systemic Diam: 1.90 cm MV A velocity: 99.80 cm/s MV E/A ratio:  0.66  Dorn Ross MD Electronically signed by Dorn Ross MD Signature Date/Time: 02/17/2022/9:23:25 AM    Final          ______________________________________________________________________________________________      Risk Assessment/Calculations          Physical Exam VS:  BP (!) 160/84 (BP Location: Left Arm, Patient Position: Sitting)   Pulse 87   Ht 5' 9 (1.753 m)   Wt 190 lb (86.2 kg)   SpO2 97%   BMI 28.06 kg/m         Wt Readings from Last 3 Encounters:  01/04/24 190 lb (86.2 kg)  11/24/23 184 lb (83.5 kg)  11/20/23 186 lb 9.6 oz (84.6 kg)    GEN: Well nourished, well developed in no acute distress NECK: No JVD; No carotid bruits CARDIAC: RRR, no murmurs, rubs, gallops RESPIRATORY:  Clear to auscultation without rales, wheezing or rhonchi  ABDOMEN: Soft, non-tender, non-distended EXTREMITIES:  No edema; No deformity   ASSESSMENT AND PLAN  Hypertension: Blood pressure elevated, will switch metoprolol  to tartrate to carvedilol  25 mg twice a day for better blood pressure control  Hyperlipidemia: Continue rosuvastatin   Coronary artery desiccation: Patient also has abnormal EKG.  Recent CT showed three-vessel coronary artery calcification.  Will obtain Myoview    Informed Consent   Shared Decision Making/Informed Consent The risks [chest pain, shortness of breath, cardiac arrhythmias, dizziness, blood pressure fluctuations, myocardial infarction, stroke/transient ischemic attack, nausea, vomiting, allergic reaction, radiation exposure, metallic taste sensation and life-threatening complications (estimated to be 1 in 10,000)], benefits (risk stratification, diagnosing coronary artery disease, treatment guidance) and alternatives of a nuclear stress test were discussed in detail with Mr. Wilhelmsen and he agrees to proceed.     Dispo: Follow-up in 6 weeks for blood pressure medication titration  Signed, Scot Ford, [place]

## 2024-01-04 NOTE — Telephone Encounter (Signed)
 From pt calls    Pharmacy Patient Advocate Encounter    Per test claim: The current 28 day co-pay is, $40.  No PA needed at this time. This test claim was processed through [facility]- copay amounts may vary at other pharmacies due to pharmacy/plan contracts, or as the patient moves through the different stages of their insurance plan.

## 2024-01-04 NOTE — Telephone Encounter (Signed)
 Call to inform about Repatha  N/A LVM

## 2024-01-04 NOTE — Addendum Note (Signed)
 Addended by: Jakie Debow K on: 01/04/2024 01:31 PM   Modules accepted: Orders

## 2024-01-12 ENCOUNTER — Telehealth (HOSPITAL_COMMUNITY): Payer: Self-pay | Admitting: *Deleted

## 2024-01-12 NOTE — Telephone Encounter (Signed)
 Left a detailed message on voicemail as a reminder about his STRESS TEST on 01/19/24 at 7:15. I also reminded patient not to take his Carvedilol  for 24 hours before his test.

## 2024-01-18 ENCOUNTER — Other Ambulatory Visit: Payer: Self-pay | Admitting: Physician Assistant

## 2024-01-18 DIAGNOSIS — I2510 Atherosclerotic heart disease of native coronary artery without angina pectoris: Secondary | ICD-10-CM

## 2024-01-18 DIAGNOSIS — R9431 Abnormal electrocardiogram [ECG] [EKG]: Secondary | ICD-10-CM

## 2024-01-19 ENCOUNTER — Ambulatory Visit (HOSPITAL_COMMUNITY)
Admission: RE | Admit: 2024-01-19 | Discharge: 2024-01-19 | Disposition: A | Discharge status: Home / Self Care | Source: Ambulatory Visit | Attending: Cardiovascular Disease | Admitting: Cardiovascular Disease

## 2024-01-19 DIAGNOSIS — R9431 Abnormal electrocardiogram [ECG] [EKG]: Secondary | ICD-10-CM | POA: Insufficient documentation

## 2024-01-19 DIAGNOSIS — I2510 Atherosclerotic heart disease of native coronary artery without angina pectoris: Secondary | ICD-10-CM | POA: Insufficient documentation

## 2024-01-19 LAB — MYOCARDIAL PERFUSION IMAGING
LV dias vol: 121 mL (ref 62–150)
LV sys vol: 57 mL (ref 4.2–5.8)
Nuc Stress EF: 41 %
Peak HR: 116 {beats}/min
Rest HR: 82 {beats}/min
Rest Nuclear Isotope Dose: 11 mCi
SDS: 8
SRS: 12
SSS: 20
ST Depression (mm): 0 mm
Stress Nuclear Isotope Dose: 31.4 mCi
TID: 1.09

## 2024-01-19 MED ORDER — REGADENOSON 0.4 MG/5ML IV SOLN
INTRAVENOUS | Status: AC
  Filled 2024-01-19: qty 5

## 2024-01-19 MED ORDER — REGADENOSON 0.4 MG/5ML IV SOLN
0.4000 mg | Freq: Once | INTRAVENOUS | Status: AC
  Administered 2024-01-19: 0.4 mg via INTRAVENOUS

## 2024-01-19 MED ORDER — TECHNETIUM TC 99M TETROFOSMIN IV KIT
11.0000 | PACK | Freq: Once | INTRAVENOUS | Status: AC | PRN
  Administered 2024-01-19: 11 via INTRAVENOUS

## 2024-01-19 MED ORDER — TECHNETIUM TC 99M TETROFOSMIN IV KIT
31.4000 | PACK | Freq: Once | INTRAVENOUS | Status: AC | PRN
  Administered 2024-01-19: 31.4 via INTRAVENOUS

## 2024-01-23 ENCOUNTER — Ambulatory Visit: Payer: Self-pay | Admitting: Physician Assistant

## 2024-01-26 ENCOUNTER — Ambulatory Visit: Payer: Self-pay | Admitting: Physician Assistant

## 2024-01-26 ENCOUNTER — Telehealth: Payer: Self-pay

## 2024-01-26 MED ORDER — NITROGLYCERIN 0.4 MG SL SUBL: 0.4000 mg | SUBLINGUAL_TABLET | SUBLINGUAL | 5 refills | Status: DC | PRN

## 2024-01-26 NOTE — Telephone Encounter (Signed)
 Left message for patient to callback to discuss recommendation from Hao Meng, PA-C.  Rx for NTG sent to [facility] in [place] with instruction to call 911 if no relief after 3rd dose.

## 2024-01-26 NOTE — Progress Notes (Signed)
 Radiologist over-read suggest possibility of esophagitis and stable pulmonary nodules. Will reassess on follow up

## 2024-01-26 NOTE — Telephone Encounter (Signed)
-----   Message from [place] sent at 01/26/2024  3:03 PM EDT ----- Regarding: nitroglycerin  please prescribe nitroglycerin  0.4mg  PRN for chest pain. Give patient ED precaution for prolonged chest pain if not alleviated after 3rd nitroglycerine.   Scheduling team has already rescheduled him for a earlier appt given abnormal stress test  Thank you Hao

## 2024-01-29 NOTE — Telephone Encounter (Signed)
 Left message for pt to call.

## 2024-02-09 ENCOUNTER — Ambulatory Visit: Attending: Emergency Medicine | Admitting: Emergency Medicine

## 2024-02-09 ENCOUNTER — Encounter: Payer: Self-pay | Admitting: Emergency Medicine

## 2024-02-09 VITALS — BP 148/84 | HR 84 | Ht 71.0 in | Wt 187.4 lb

## 2024-02-09 DIAGNOSIS — N1832 Chronic kidney disease, stage 3b: Secondary | ICD-10-CM | POA: Diagnosis not present

## 2024-02-09 DIAGNOSIS — I2510 Atherosclerotic heart disease of native coronary artery without angina pectoris: Secondary | ICD-10-CM | POA: Diagnosis not present

## 2024-02-09 DIAGNOSIS — E785 Hyperlipidemia, unspecified: Secondary | ICD-10-CM

## 2024-02-09 DIAGNOSIS — R9439 Abnormal result of other cardiovascular function study: Secondary | ICD-10-CM | POA: Diagnosis not present

## 2024-02-09 DIAGNOSIS — Z8679 Personal history of other diseases of the circulatory system: Secondary | ICD-10-CM

## 2024-02-09 DIAGNOSIS — I10 Essential (primary) hypertension: Secondary | ICD-10-CM | POA: Diagnosis not present

## 2024-02-09 NOTE — Progress Notes (Signed)
 Cardiology Office Note:    Date:  02/09/2024  ID:  [name] [name], DOB 1960/08/26, MRN [id number] PCP: Bevely Doffing, FNP  [facility] Providers Cardiologist:  Oneil Parchment, MD       Patient Profile:       Chief Complaint: 1 month follow-up History of Present Illness:  [name] [name] is a 63 y.o. male with visit-pertinent history of hypertension, hyperlipidemia intolerant of statins, type 2 diabetes, coronary artery disease, CKD and history of hemorrhagic CVA  Echocardiogram obtained on 02/17/2022 that showed LVEF 50 to 55%, mild LVH, grade 1 DD, normal RV, trivial MR.  Carotid Doppler obtained in October 2024 showed minimal plaque.  Due to statin intolerance, patient was referred to the lipid clinic.  Prior to this visit it was reported his nephrologist has stopped his Zetia  due to renal function decline.  He was on 40 mg of daily rosuvastatin .  He was recommended for the patient is started on PCSK9 inhibitor, primary physician was initiated however does not appear patient ever started on injectable medication.  He was admitted at [facility] in November 2024 due to left retinal detachment.  CTA chest obtained on 11/11/2023 showed extensive multivessel coronary artery calcification, multiple sub-5 mm pulmonary nodules seen bilaterally, they were considered benign given their stability over time.  Patient was last seen in clinic on 01/04/2024 by [place], [place].  Patient was concerned about the multivessel coronary artery calcification seen on recent chest CT.  His EKG was abnormal with inferolateral T wave inversion.  His blood pressure was elevated and his metoprolol  succinate was switched to carvedilol  25 mg twice daily for better blood pressure control.  Lexiscan  Myoview  was completed on 01/19/2024, consistent with infarction with peri-infarct ischemia.  The LVEF was moderately reduced (30-44%) the study was intermediate risk.   Discussed the use of AI scribe software for clinical note transcription with  the patient, who gave verbal consent to proceed.  History of Present Illness [name] [name] is a 63 year old male with coronary artery disease and a history of hemorrhagic stroke who presents for follow-up after an abnormal stress test.  Today patient is doing well overall.  He continue to experience no acute cardiovascular concerns or complaints.  He has no current symptoms of chest pain, shortness of breath, syncope, presyncope, or palpitations. He is concerned about plaque buildup in his arteries and seeks to understand its extent.   He had a hemorrhagic stroke in 2018, described as a left-sided, four-centimeter bleed, attributed to high blood pressure. He has had hypertension since 1995 and is still seeking effective medication management. His blood pressure remains elevated with blood pressures at home ranging in the 150s to 160s.  Overall he does stay relatively active.  He swims at least 3 times a week at the [facility] and aims to swim at least 20 laps.  He experiences no exertional symptoms or limitations while he exercises.   Review of systems:  Please see the history of present illness. All other systems are reviewed and otherwise negative.      Studies Reviewed:        Lexiscan  Myoview  01/19/2024   Findings are consistent with infarction with peri-infarct ischemia. The study is intermediate risk.   No ST deviation was noted.   LV perfusion is abnormal. There is evidence of ischemia. There is evidence of infarction. Defect 1: There is a large defect with severe reduction in uptake present in the mid to basal inferior, inferolateral and inferoseptal location(s) that is partially  reversible. Viability is present. There is abnormal wall motion in the defect area. Consistent with infarction and peri-infarct ischemia.   Left ventricular function is abnormal. Global function is moderately reduced. There was a single regional abnormality. Nuclear stress EF: 41%. The left ventricular ejection  fraction is moderately decreased (30-44%). End diastolic cavity size is mildly enlarged. End systolic cavity size is mildly enlarged.   CT images were obtained for attenuation correction and were examined for the presence of coronary calcium  when appropriate.   Coronary calcium  was present on the attenuation correction CT images. Moderate coronary calcifications were present. Coronary calcifications were present in the left anterior descending artery, left circumflex artery and right coronary artery distribution(s).   Prior study not available for comparison.  Echocardiogram 02/17/2022 1. Left ventricular ejection fraction, by estimation, is 50 to 55%. The  left ventricle has low normal function. The left ventricle has no regional  wall motion abnormalities. There is mild left ventricular hypertrophy.  Left ventricular diastolic  parameters are consistent with Grade I diastolic dysfunction (impaired  relaxation).   2. Right ventricular systolic function is normal. The right ventricular  size is normal. Tricuspid regurgitation signal is inadequate for assessing  PA pressure.   3. The mitral valve is normal in structure. Trivial mitral valve  regurgitation. No evidence of mitral stenosis.   4. The tricuspid valve is abnormal.   5. The aortic valve is tricuspid. Aortic valve regurgitation is not  visualized. No aortic stenosis is present.   Risk Assessment/Calculations:     HYPERTENSION CONTROL Vitals:   02/09/24 0808 02/09/24 0841  BP: (!) 154/88 (!) 148/84    The patient's blood pressure is elevated above target today.  In order to address the patient's elevated BP: A referral to the nephrologist was placed.           Physical Exam:   VS:  BP (!) 148/84 (BP Location: Left Arm, Patient Position: Sitting, Cuff Size: Normal)   Pulse 84   Ht 5' 11 (1.803 m)   Wt 187 lb 6.4 oz (85 kg)   SpO2 98%   BMI 26.14 kg/m    Wt Readings from Last 3 Encounters:  02/09/24 187 lb 6.4 oz (85  kg)  01/19/24 190 lb (86.2 kg)  01/04/24 190 lb (86.2 kg)    GEN: Well nourished, well developed in no acute distress NECK: No JVD; No carotid bruits CARDIAC: RRR, no murmurs, rubs, gallops RESPIRATORY:  Clear to auscultation without rales, wheezing or rhonchi  ABDOMEN: Soft, non-tender, non-distended EXTREMITIES:  No edema; No acute deformity     Assessment and Plan:  Coronary artery disease CT chest 10/2023 showed extensive multivessel coronary artery calcification Lexiscan  Myoview  01/2024 was consistent with infarction with peri-infarct ischemia with reduction in uptake present in the mid to basal inferior, inferolateral and inferoseptal location.  Nuclear stress EF 41% - Today patient remains entirely asymptomatic.  He denies chest pains or ever having any exertional symptoms.  He continues to swim 3 times weekly at least 20 laps without symptoms or limitation. - Spoke with DOD Dr. Kate regarding case as patient remains entirely asymptomatic despite abnormal stress test and his CKD with recent creatinine of 1.91 and GFR 40 - Decision was made to pursue cardiac MRI for further evaluation and not pursue cardiac catheterization - BMET today  Hypertension Blood pressure today is 148/84 and not well-controlled His BP has been challenging to control - He was seen for consult once by nephrologist in [place].  He reports he was not pleased during office visit and failed to follow-up.  He would like a different nephrologist - Plan to refer to [facility] for further evaluation of his CKD and hypertension - Continue amlodipine  10 mg daily, carvedilol  25 mg twice daily, chlorthalidone  50 mg daily, and irbesartan  75 mg daily  Hyperlipidemia, LDL goal <55 LDL 144 on 11/2023 Zetia  discontinued due to kidney function He has recently restarted Repatha  currently on his second dose - Plan for repeat lipid panel at follow-up visit - Continue Repatha  and rosuvastatin  40 mg daily  Prior  stroke Intracranial hemorrhage 4 cm in 2018 - Goal to maintain adequate blood pressure control - No new neurological symptoms today  Chronic kidney disease Creatinine 1.91 and GFR 40 on 11/2023 - Had 1 consult with nephrologist in [place].  Reports he was not pleased with visit.  Would like a different nephrologist - Referral placed to [facility] today      Dispo:  Return in about 8 weeks (around 04/05/2024).  Signed, Lum LITTIE Louis, NP

## 2024-02-09 NOTE — Patient Instructions (Addendum)
 Medication Instructions:  NO CHANGES   Lab Work: BMET AND CBC TO BE DONE TODAY.   Testing/Procedures: Your physician has requested that you have a cardiac MRI. Cardiac MRI uses a computer to create images of your heart as its beating, producing both still and moving pictures of your heart and major blood vessels. For further information please visit [email or web address]. Please follow the instruction sheet given to you today for more information.   Follow-Up: At [facility], you and your health needs are our priority.  As part of our continuing mission to provide you with exceptional heart care, our providers are all part of one team.  This team includes your primary Cardiologist (physician) and Advanced Practice Providers or APPs (Physician Assistants and Nurse Practitioners) who all work together to provide you with the care you need, when you need it.  Your next appointment:    8 WEEKS   Provider:   Oneil Parchment, MD OR Lum Louis, NP

## 2024-02-09 NOTE — Telephone Encounter (Signed)
 No PA needed for Repatha .

## 2024-02-10 ENCOUNTER — Ambulatory Visit: Payer: Self-pay | Admitting: Emergency Medicine

## 2024-02-10 LAB — BASIC METABOLIC PANEL WITH GFR
BUN/Creatinine Ratio: 13 (ref 10–24)
BUN: 24 mg/dL (ref 8–27)
CO2: 22 mmol/L (ref 20–29)
Calcium: 10.4 mg/dL — ABNORMAL HIGH (ref 8.6–10.2)
Chloride: 101 mmol/L (ref 96–106)
Creatinine, Ser: 1.92 mg/dL — ABNORMAL HIGH (ref 0.76–1.27)
Glucose: 212 mg/dL — ABNORMAL HIGH (ref 70–99)
Potassium: 4.6 mmol/L (ref 3.5–5.2)
Sodium: 139 mmol/L (ref 134–144)
eGFR: 39 mL/min/1.73 — ABNORMAL LOW (ref >59)

## 2024-02-10 LAB — CBC
Hematocrit: 41.4 % (ref 37.5–51.0)
Hemoglobin: 13.6 g/dL (ref 13.0–17.7)
MCH: 29.1 pg (ref 26.6–33.0)
MCHC: 32.9 g/dL (ref 31.5–35.7)
MCV: 89 fL (ref 79–97)
Platelets: 291 x10E3/uL (ref 150–450)
RBC: 4.67 x10E6/uL (ref 4.14–5.80)
RDW: 13.1 % (ref 11.6–15.4)
WBC: 4.8 x10E3/uL (ref 3.4–10.8)

## 2024-02-14 ENCOUNTER — Telehealth: Payer: Self-pay | Admitting: Emergency Medicine

## 2024-02-14 NOTE — Telephone Encounter (Signed)
 Pt called in stating he was waiting to hear back about nephrology referral. [facility] doesn't use EP[facility] so the referral would need to faxed over.   Fax: [phone]   He also asked about the HTN Clinic referral. The referral was put in and then cancelled, so he just wants to confirm if he needs to make a consult or not.

## 2024-02-14 NOTE — Telephone Encounter (Signed)
 Routed to [facility] NP about HTN referral.  Routed to scheduling team about referral to [facility]

## 2024-02-16 DIAGNOSIS — E11319 Type 2 diabetes mellitus with unspecified diabetic retinopathy without macular edema: Secondary | ICD-10-CM | POA: Insufficient documentation

## 2024-02-19 NOTE — Telephone Encounter (Signed)
 Billy Salines, CMA to Me (Selected Message)    02/15/24  5:25 PM The referral to the hypertension clinic was canceled per [facility]. Referral to a nephrologist was put in so that the nephrologist can manage the hypertension per [facility].

## 2024-02-20 ENCOUNTER — Encounter (HOSPITAL_BASED_OUTPATIENT_CLINIC_OR_DEPARTMENT_OTHER): Payer: Self-pay

## 2024-03-01 ENCOUNTER — Ambulatory Visit: Admitting: Physician Assistant

## 2024-03-08 ENCOUNTER — Encounter (HOSPITAL_COMMUNITY): Payer: Self-pay

## 2024-03-11 ENCOUNTER — Encounter (HOSPITAL_COMMUNITY): Payer: Self-pay | Admitting: *Deleted

## 2024-03-11 ENCOUNTER — Other Ambulatory Visit: Payer: Self-pay | Admitting: Emergency Medicine

## 2024-03-11 ENCOUNTER — Ambulatory Visit (HOSPITAL_COMMUNITY)
Admission: RE | Admit: 2024-03-11 | Discharge: 2024-03-11 | Disposition: A | Discharge status: Home / Self Care | Source: Ambulatory Visit | Attending: Emergency Medicine | Admitting: Emergency Medicine

## 2024-03-11 DIAGNOSIS — R9439 Abnormal result of other cardiovascular function study: Secondary | ICD-10-CM | POA: Insufficient documentation

## 2024-03-11 DIAGNOSIS — I2510 Atherosclerotic heart disease of native coronary artery without angina pectoris: Secondary | ICD-10-CM

## 2024-03-11 MED ORDER — GADOBUTROL 1 MMOL/ML IV SOLN
10.0000 mL | Freq: Once | INTRAVENOUS | Status: AC | PRN
  Administered 2024-03-11: 10 mL via INTRAVENOUS

## 2024-03-13 ENCOUNTER — Telehealth: Payer: Self-pay | Admitting: Pharmacist

## 2024-03-13 DIAGNOSIS — E782 Mixed hyperlipidemia: Secondary | ICD-10-CM

## 2024-03-13 NOTE — Telephone Encounter (Signed)
 Lipid lab is due, call pt to remind. N/A LVM. MyChart sent.

## 2024-03-18 NOTE — Telephone Encounter (Signed)
Patient returned pharmacist's call.

## 2024-03-18 NOTE — Telephone Encounter (Signed)
 Called in the morning, patient was at restaurant  so requested call back. Called around 4:58 N/A LVM

## 2024-03-21 ENCOUNTER — Ambulatory Visit: Payer: Self-pay | Admitting: Pharmacist

## 2024-03-21 LAB — LIPID PANEL
Chol/HDL Ratio: 2.6 ratio (ref 0.0–5.0)
Cholesterol, Total: 133 mg/dL (ref 100–199)
HDL: 51 mg/dL (ref >39)
LDL Chol Calc (NIH): 55 mg/dL (ref 0–99)
Triglycerides: 160 mg/dL — ABNORMAL HIGH (ref 0–149)
VLDL Cholesterol Cal: 27 mg/dL (ref 5–40)

## 2024-03-21 MED ORDER — ROSUVASTATIN CALCIUM 40 MG PO TABS: 40.0000 mg | ORAL_TABLET | Freq: Every day | ORAL | 3 refills | Status: AC

## 2024-03-21 NOTE — Telephone Encounter (Signed)
 Lab discussed over the phone. LDL at goal advised to continue taking Crestor  40 mg daily and Repatha  140 mg every 14 days. Advised to cut down on refine carb and fat intake for slightly  elevated TG level.

## 2024-03-22 NOTE — Telephone Encounter (Signed)
 See other encounter - Lab discussed over the phone. LDL at goal advised to continue taking Crestor  40 mg daily and Repatha  140 mg every 14 days. Advised to cut down on refine carb and fat intake for slightly  elevated TG level.

## 2024-03-25 NOTE — Progress Notes (Signed)
 VITREORETINAL SURGERY DIVISION  [name] [name] is a 63 y.o. male patient referred by Self No address on file in the setting of diabetic retinopathy. Previously sees Dr. 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 (prior non-hi risk PDR) Repeat exam 03/2024  DME OD S/p intravitreal antiVEGF 11/27 -resolved  OS TRD + RRD, moderately bullous with ruggae nasal, inf large relatively flat SRH peripheral Onset dec [place] 07/2022 s/p CE/sIOL/iris hooks/25g PPV/dil kenalog/MP/inf 180RR/PFC/EL/PI/FAX/1000 cs SO OS (Kasetty/Leiderman) 1.14.25 Intra-op finding of IN PVR C (starfold) with significant anterior retinal neovascularization, requiring retinectomy s/p 25PPV/SOR/ERM+ILMPEEL/15%C3F8 OS 12/19/2023 (Zheng/Leiderman) Intra-op: ERM + ILM peeled, markedly thin fovea, 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 Recurrent RD following gas resolution  s/p 25g PPB/triesence/PFO/MP/inferior retinectomy/EL/FAX/1000 [place] SO OS 02/20/2024 (Shen/Leiderman) Explained no food or drink after midnight (patient has not been compliant in previous surgeries) Intra-op: PVR C2.12, total RD, multiple retinal holes We had an extensive discussion re: role of adherence to recommended therapeutic plan. Wilkie Moats relayed that he has not done any positioning after surgeries, and we discuss this in detail.  attached under oil Doing well, denies pain 03/2024 Some AM discharge in the setting of disolved Vicryl sutures   NS OD mild   RTC:    2 months [x]  Exam OU        Optos OU [x]  OCT macula  []  B-scan ultrasound  []  Fluorescein angiogram []  Possible intravitreal injection -  Transit []  OD  []  OS []  Eylea []  Avastin []  Vabysmo []  Ozurdex []  Intravitreal injection ONLY - no exam or  imaging   ----------------------------------------------------------- I personally evaluated this patient and corroborated findings as documented in the resident/fellow/technician entries. I am in agreement with the management plan as outlined above and counseled the patient/parents on the therapeutic recommendations detailed above.

## 2024-04-05 ENCOUNTER — Telehealth: Payer: Self-pay | Admitting: Pharmacy Technician

## 2024-04-05 ENCOUNTER — Encounter: Payer: Self-pay | Admitting: Emergency Medicine

## 2024-04-05 ENCOUNTER — Ambulatory Visit: Attending: Emergency Medicine | Admitting: Emergency Medicine

## 2024-04-05 VITALS — BP 148/90 | HR 85 | Ht 71.0 in | Wt 191.8 lb

## 2024-04-05 DIAGNOSIS — I2510 Atherosclerotic heart disease of native coronary artery without angina pectoris: Secondary | ICD-10-CM | POA: Diagnosis not present

## 2024-04-05 DIAGNOSIS — Z794 Long term (current) use of insulin: Secondary | ICD-10-CM

## 2024-04-05 DIAGNOSIS — N1832 Chronic kidney disease, stage 3b: Secondary | ICD-10-CM

## 2024-04-05 DIAGNOSIS — I255 Ischemic cardiomyopathy: Secondary | ICD-10-CM

## 2024-04-05 DIAGNOSIS — E785 Hyperlipidemia, unspecified: Secondary | ICD-10-CM | POA: Diagnosis not present

## 2024-04-05 DIAGNOSIS — Z8679 Personal history of other diseases of the circulatory system: Secondary | ICD-10-CM

## 2024-04-05 DIAGNOSIS — E782 Mixed hyperlipidemia: Secondary | ICD-10-CM

## 2024-04-05 DIAGNOSIS — I5020 Unspecified systolic (congestive) heart failure: Secondary | ICD-10-CM

## 2024-04-05 DIAGNOSIS — E1122 Type 2 diabetes mellitus with diabetic chronic kidney disease: Secondary | ICD-10-CM

## 2024-04-05 DIAGNOSIS — I1A0 Resistant hypertension: Secondary | ICD-10-CM

## 2024-04-05 LAB — BASIC METABOLIC PANEL WITH GFR
BUN/Creatinine Ratio: 12 (ref 10–24)
BUN: 21 mg/dL (ref 8–27)
CO2: 23 mmol/L (ref 20–29)
Calcium: 9.4 mg/dL (ref 8.6–10.2)
Chloride: 100 mmol/L (ref 96–106)
Creatinine, Ser: 1.7 mg/dL — ABNORMAL HIGH (ref 0.76–1.27)
Glucose: 317 mg/dL — ABNORMAL HIGH (ref 70–99)
Potassium: 4.6 mmol/L (ref 3.5–5.2)
Sodium: 137 mmol/L (ref 134–144)
eGFR: 45 mL/min/1.73 — ABNORMAL LOW (ref >59)

## 2024-04-05 MED ORDER — SACUBITRIL-VALSARTAN 24-26 MG PO TABS: 1.0000 | ORAL_TABLET | Freq: Two times a day (BID) | ORAL | 3 refills | Status: AC

## 2024-04-05 NOTE — Patient Instructions (Signed)
 Medication Instructions:  STOP TAKING IRBESARTAN .  START TAKING ENTRESTO 24/26 TWICE DAILY.  Lab Work: BMET TODAY THEN REPEAT IN 1 WEEK.  Testing/Procedures: NONE  Follow-Up: At [facility], you and your health needs are our priority.  As part of our continuing mission to provide you with exceptional heart care, our providers are all part of one team.  This team includes your primary Cardiologist (physician) and Advanced Practice Providers or APPs (Physician Assistants and Nurse Practitioners) who all work together to provide you with the care you need, when you need it.  Your next appointment:   1 MONTH WITH MADISON FOUNTAIN, NP 3 MONTHS WITH DR. JEFFRIE, MD  Provider:   Oneil JEFFRIE, MD

## 2024-04-05 NOTE — Telephone Encounter (Signed)
 Pharmacy Patient Advocate Encounter  Received notification from [employer] that Prior Authorization for Sacubitril-Valsartan has been APPROVED from 04/05/24 to 04/05/25   PA #/Case ID/Reference #: [id number]

## 2024-04-05 NOTE — Telephone Encounter (Signed)
    Pharmacy Patient Advocate Encounter   Received notification from Onbase that prior authorization for sacubitril/valsartan is required/requested.   Insurance verification completed.   The patient is insured through [facility].   Per test claim: PA required; PA submitted to above mentioned insurance via Latent Key/confirmation #/EOC [id number] Status is pending

## 2024-04-05 NOTE — Progress Notes (Signed)
 Cardiology Office Note:    Date:  04/07/2024  ID:  [name] [name], DOB 1960-11-20, MRN [id number] PCP: Bevely Doffing, FNP  [facility] Providers Cardiologist:  Oneil Parchment, MD       Patient Profile:       Chief Complaint: 10-month follow-up History of Present Illness:  [name] [name] is a 63 y.o. male with visit-pertinent history of hypertension, hyperlipidemia intolerant of statins, type 2 diabetes, coronary artery disease, CKD and history of hemorrhagic CVA   Echocardiogram obtained on 02/17/2022 that showed LVEF 50 to 55%, mild LVH, grade 1 DD, normal RV, trivial MR.  Carotid Doppler obtained in October 2024 showed minimal plaque.  Due to statin intolerance, patient was referred to the lipid clinic.  Prior to this visit it was reported his nephrologist has stopped his Zetia  due to renal function decline.  He was on 40 mg of daily rosuvastatin .  He was recommended for the patient is started on PCSK9 inhibitor, primary physician was initiated however does not appear patient ever started on injectable medication.  He was admitted at [facility] in November 2024 due to left retinal detachment.  CTA chest obtained on 11/11/2023 showed extensive multivessel coronary artery calcification, multiple sub-5 mm pulmonary nodules seen bilaterally, they were considered benign given their stability over time.   Patient was last seen in clinic on 01/04/2024 by [place], [place].  Patient was concerned about the multivessel coronary artery calcification seen on recent chest CT.  His EKG was abnormal with inferolateral T wave inversion.  His blood pressure was elevated and his metoprolol  succinate was switched to carvedilol  25 mg twice daily for better blood pressure control.   Lexiscan  Myoview  was completed on 01/19/2024, consistent with infarction with peri-infarct ischemia.  The LVEF was moderately reduced (30-44%) the study was intermediate risk.  He was last seen in clinic on 02/09/2024.  He was doing well without chest  pains or dyspnea.  He stays active swimming at the [facility] at least 3 times a week without any exertional symptoms.  His blood pressure was not well-controlled.  He was referred to [facility] for further evaluation of CKD and hypertension.  Decision was made to pursue cardiac MRI to further pursue results of his abnormal stress test and not to pursue cardiac catheterization given his CKD.  Cardiac MRI was completed on 03/11/2024 showing ischemic cardiomyopathy with basal-apical inferior wall infarction involving the inferolateral wall mid-- apical with near transmural delayed enhancement suggesting no viability.  LVEF 37% and RVEF 50%.   Discussed the use of AI scribe software for clinical note transcription with the patient, who gave verbal consent to proceed.  History of Present Illness [name] [name] is a 63 year old male with coronary artery disease and chronic kidney disease who presents for follow-up.  Today he is doing well without acute cardiovascular concerns or complaints.  He remains asymptomatic with no chest pain or shortness of breath and maintains an active lifestyle, swimming or walking three times a week without any exertional symptoms or limitation.  Chronic kidney disease is present with a creatinine level of 1.9 and a GFR of 39, stable over the past year. Blood pressure at home is around 150/90-96. Current medications include amlodipine , carvedilol , chlorthalidone , Repatha , insulin , irbesartan , metformin , nitroglycerin , and rosuvastatin .   Diabetes management includes Mounjaro, insulin , and metformin , with an A1c of 9.5% as of June. He acknowledges a diet high in processed foods. Family history includes diabetes and strokes, with a personal history of a hemorrhagic stroke in  2018.  He reports weight loss from pant size 38 to 34, attributed to Mounjaro, which he has been on for less than a year. Repatha  has been used for three months, improving cholesterol levels to 55.  He is set  to reestablish with nephrology on 04/24/2024 here in [place] with [facility].  He denies orthopnea, PND, LEE, palpitations, syncope, presyncope, lightheadedness, dizziness    Review of systems:  Please see the history of present illness. All other systems are reviewed and otherwise negative.      Studies Reviewed:      Cardiac MRI 03/11/2024 1. Ischemic cardiomyopathy. There is basal-apical inferior wall infarction, involving the inferolateral wall mid-apical, with near transmural delayed enhancement, suggesting no viability.   2. Normal left ventricular chamber size with moderately reduced LV systolic function, LVEF 37%.   3. Normal right ventricular chamber size and borderline normal function, RVEF 50%.  Lexiscan  Myoview  01/19/2024   Findings are consistent with infarction with peri-infarct ischemia. The study is intermediate risk.   No ST deviation was noted.   LV perfusion is abnormal. There is evidence of ischemia. There is evidence of infarction. Defect 1: There is a large defect with severe reduction in uptake present in the mid to basal inferior, inferolateral and inferoseptal location(s) that is partially reversible. Viability is present. There is abnormal wall motion in the defect area. Consistent with infarction and peri-infarct ischemia.   Left ventricular function is abnormal. Global function is moderately reduced. There was a single regional abnormality. Nuclear stress EF: 41%. The left ventricular ejection fraction is moderately decreased (30-44%). End diastolic cavity size is mildly enlarged. End systolic cavity size is mildly enlarged.   CT images were obtained for attenuation correction and were examined for the presence of coronary calcium  when appropriate.   Coronary calcium  was present on the attenuation correction CT images. Moderate coronary calcifications were present. Coronary calcifications were present in the left anterior descending artery, left  circumflex artery and right coronary artery distribution(s).   Prior study not available for comparison.   Echocardiogram 02/17/2022 1. Left ventricular ejection fraction, by estimation, is 50 to 55%. The  left ventricle has low normal function. The left ventricle has no regional  wall motion abnormalities. There is mild left ventricular hypertrophy.  Left ventricular diastolic  parameters are consistent with Grade I diastolic dysfunction (impaired  relaxation).   2. Right ventricular systolic function is normal. The right ventricular  size is normal. Tricuspid regurgitation signal is inadequate for assessing  PA pressure.   3. The mitral valve is normal in structure. Trivial mitral valve  regurgitation. No evidence of mitral stenosis.   4. The tricuspid valve is abnormal.   5. The aortic valve is tricuspid. Aortic valve regurgitation is not  visualized. No aortic stenosis is present.   Risk Assessment/Calculations:             Physical Exam:   VS:  BP (!) 148/90   Pulse 85   Ht 5' 11 (1.803 m)   Wt 191 lb 12.8 oz (87 kg)   SpO2 99%   BMI 26.75 kg/m    Wt Readings from Last 3 Encounters:  04/05/24 191 lb 12.8 oz (87 kg)  02/09/24 187 lb 6.4 oz (85 kg)  01/19/24 190 lb (86.2 kg)    GEN: Well nourished, well developed in no acute distress NECK: No JVD; No carotid bruits CARDIAC: RRR, no murmurs, rubs, gallops RESPIRATORY:  Clear to auscultation without rales, wheezing or rhonchi  ABDOMEN:  Soft, non-tender, non-distended EXTREMITIES:  No edema; No acute deformity      Assessment and Plan:  HFrEF Ischemic cardiomyopathy Cardiac MRI 02/2024 showing ischemic cardiomyopathy with LVEF 37% with no suggestive viability - NYHA class I - Today patient appears euvolemic and well compensated on exam - Continues to maintain an active lifestyle without any limitations - GDMT optimization is limited due to CKD with creatinine 1.92 and GFR 39 on 01/2024.  Kidney function seems to be  fairly stable over the past year.  Set to establish with nephrology in 11/12 - Plan to discontinue irbesartan  today and start Entresto 24-26 mg twice daily - Continue carvedilol  25 mg twice daily - Will consider discontinuation of chlorthalidone  and starting  MRA or SGLT2i pending nephrologist recommendations - Can also consider addition of BiDil at follow-up visit given history of uncontrolled hypertension  Coronary artery disease CT chest 10/2023 showed extensive multivessel coronary artery calcification Lexiscan  Myoview  01/2024 was consistent with infarction with peri-infarct ischemia with reduction in uptake present in the mid to basal inferior, inferolateral and inferoseptal location.  Nuclear stress EF 41% cMRI 02/2024 showing ischemic cardiomyopathy with basal-apical inferior wall infarction, involving mid inferolateral wall mid-- apical, with near transmural delayed enhancement suggesting no viability - Today patient remains stable without chest pains.  He remains very active without limitations or exertional symptoms - Given cardiac MRI suggesting no viability and CKD limiting inability to pursue cardiac catheterization we will continue with medical management - Continue aspirin  81 mg daily, rosuvastatin  40 mg daily, and Repatha  140 mg q. 14 days   Resistant hypertension Blood pressure today is 148/90 not well-controlled His BP has been challenging to control in the past - He is scheduled to establish with nephrology on 11/12 - Plan to discontinue irbesartan  and start Entresto 24-26 mg twice daily - Continue amlodipine  10 mg daily, carvedilol  25 mg twice daily, chlorthalidone  50 mg daily    Hyperlipidemia, LDL goal <55 LDL 55 on 03/2024 and controlled - Continue Repatha  140 mg q. 14 days and rosuvastatin  40 mg daily   Prior stroke Intracranial hemorrhage 4 cm in 2018 - Goal to maintain adequate blood pressure control - No new neurological symptoms today   Chronic kidney  disease Creatinine 1.92 and GFR 39 on 01/2024 - Set to reestablish with nephrology on 11/12  T2DM A1c 9.5% on 11/2023 and uncontrolled - Managed on insulin , metformin , and Mounjaro      Dispo:  Return in about 1 month (around 05/06/2024).  Signed, Lum LITTIE Louis, NP

## 2024-04-07 ENCOUNTER — Encounter: Payer: Self-pay | Admitting: Emergency Medicine

## 2024-04-08 ENCOUNTER — Ambulatory Visit: Payer: Self-pay | Admitting: Emergency Medicine

## 2024-04-08 DIAGNOSIS — Z79899 Other long term (current) drug therapy: Secondary | ICD-10-CM

## 2024-04-10 NOTE — Progress Notes (Signed)
 Seen via mychart. BMET order has already been placed

## 2024-04-15 NOTE — Addendum Note (Signed)
 Addended by: BILLY CAMELIA CROME on: 04/15/2024 09:27 AM   Modules accepted: Orders

## 2024-04-23 ENCOUNTER — Ambulatory Visit

## 2024-04-23 VITALS — BP 148/89 | HR 88 | Ht 71.0 in | Wt 187.0 lb

## 2024-04-23 DIAGNOSIS — E538 Deficiency of other specified B group vitamins: Secondary | ICD-10-CM

## 2024-04-23 DIAGNOSIS — Z794 Long term (current) use of insulin: Secondary | ICD-10-CM | POA: Diagnosis not present

## 2024-04-23 DIAGNOSIS — N1832 Chronic kidney disease, stage 3b: Secondary | ICD-10-CM

## 2024-04-23 DIAGNOSIS — I5022 Chronic systolic (congestive) heart failure: Secondary | ICD-10-CM

## 2024-04-23 DIAGNOSIS — K5900 Constipation, unspecified: Secondary | ICD-10-CM

## 2024-04-23 DIAGNOSIS — E1159 Type 2 diabetes mellitus with other circulatory complications: Secondary | ICD-10-CM | POA: Diagnosis not present

## 2024-04-23 DIAGNOSIS — Z1211 Encounter for screening for malignant neoplasm of colon: Secondary | ICD-10-CM

## 2024-04-23 MED ORDER — NOVOLOG FLEXPEN RELION 100 UNIT/ML ~~LOC~~ SOPN: 7.0000 [IU] | PEN_INJECTOR | Freq: Three times a day (TID) | SUBCUTANEOUS | 4 refills | Status: AC

## 2024-04-23 MED ORDER — LANTUS SOLOSTAR 100 UNIT/ML ~~LOC~~ SOPN: 20.0000 [IU] | PEN_INJECTOR | Freq: Two times a day (BID) | SUBCUTANEOUS | 5 refills | Status: AC

## 2024-04-23 MED ORDER — CYANOCOBALAMIN 1000 MCG/ML IJ SOLN
1000.0000 ug | INTRAMUSCULAR | Status: AC
  Administered 2024-04-23: 1000 ug via INTRAMUSCULAR

## 2024-04-23 NOTE — Progress Notes (Unsigned)
 Established Patient Office Visit  Subjective   Patient ID: [name] [name], male    DOB: 1961-04-14  Age: 63 y.o. MRN: [id number]  Chief Complaint  Patient presents with   Medical Management of Chronic Issues    Pt here for colon issues, states sometimes it is balls and sometimes it is stringy last colonoscopy he had they didn't finish it because he vomited, sometimes he's constipated for 2 weeks then it will all come out at once, doesn't cause any pain.     HPI Discussed the use of AI scribe software for clinical note transcription with the patient, who gave verbal consent to proceed.  History of Present Illness    [name] [name] is a 63 year old male with diabetes and cardiomyopathy who presents with shoulder pain and medication management issues.  Shoulder pain - Bilateral shoulder pain attributed to sleeping on his side following multiple eye surgeries - History of three surgeries on the left eye, with the most recent being the last required for that eye  Diabetes mellitus management - Hemoglobin A1c increased to 9.5 from previous 7.0 - Has not taken insulin  for two months due to pharmacy refill issues - Previously used Novolog  and Levemir ; Levemir  is no longer available  Cardiomyopathy and hypertension - Diagnosed cardiomyopathy with ejection fraction of 37% - Currently taking Entresto and nitroglycerin  as needed - Elevated blood pressure readings - Awaiting medication adjustment pending improvement in kidney function  Constipation - Bowel movements as infrequent as every two weeks - Episodes of sudden, complete evacuation - Use of over-the-counter medications without relief - Changes in stool consistency - History of incomplete gastrointestinal evaluation due to aspiration during a previous procedure  Vitamin b12 deficiency - Low B12 levels - Prefers intramuscular B12 injections over oral supplementation     Patient Active Problem List   Diagnosis Date Noted    Constipation 04/25/2024   Heart failure, systolic (HCC) 04/25/2024   Diabetic retinopathy associated with type 2 diabetes mellitus (HCC) 02/16/2024   B12 deficiency 11/24/2023   Vitamin D  deficiency 11/24/2023   BMI 26.0-26.9,adult 11/20/2023   OSA (obstructive sleep apnea) 10/09/2023   Lung nodule 10/09/2023   CKD (chronic kidney disease) stage 3, GFR 30-59 ml/min (HCC) 06/11/2023   Hypokalemia 04/21/2022   Hyperosmolar non-ketotic state due to type 2 diabetes mellitus (HCC) 04/20/2022   Acute renal failure superimposed on stage 3b chronic kidney disease (HCC) 04/20/2022   Hyponatremia 04/20/2022   Hx of arterial ischemic stroke 04/20/2022   DM type 2 causing vascular disease (HCC) 05/03/2021   Mixed hyperlipidemia 05/03/2021   Primary hypertension 05/03/2021    ROS    Objective:     BP (!) 148/89   Pulse 88   Ht 5' 11 (1.803 m)   Wt 187 lb (84.8 kg)   SpO2 98%   BMI 26.08 kg/m  BP Readings from Last 3 Encounters:  04/23/24 (!) 148/89  04/05/24 (!) 148/90  02/09/24 (!) 148/84   Wt Readings from Last 3 Encounters:  04/23/24 187 lb (84.8 kg)  04/05/24 191 lb 12.8 oz (87 kg)  02/09/24 187 lb 6.4 oz (85 kg)      Physical Exam Vitals and nursing note reviewed.  Constitutional:      Appearance: Normal appearance.  HENT:     Head: Normocephalic.  Eyes:     Extraocular Movements: Extraocular movements intact.     Pupils: Pupils are equal, round, and reactive to light.  Cardiovascular:     Rate and  Rhythm: Normal rate and regular rhythm.  Pulmonary:     Effort: Pulmonary effort is normal.     Breath sounds: Normal breath sounds.  Musculoskeletal:     Cervical back: Normal range of motion and neck supple.  Neurological:     Mental Status: He is alert and oriented to person, place, and time.  Psychiatric:        Mood and Affect: Mood normal.        Thought Content: Thought content normal.      No results found for any visits on 04/23/24.    The ASCVD  Risk score (Arnett DK, et al., 2019) failed to calculate for the following reasons:   Risk score cannot be calculated because patient has a medical history suggesting prior/existing ASCVD    Assessment & Plan:   Problem List Items Addressed This Visit       Cardiovascular and Mediastinum   Heart failure, systolic (HCC) (Chronic)   Heart failure with reduced ejection fraction Ejection fraction at 37%. Blood pressure elevated. Cardiologist to adjust medication post kidney function improvement. - Continue Entresto and nitroglycerin  as needed. - Monitor blood pressure and kidney function.      DM type 2 causing vascular disease (HCC) - Primary   Type 2 diabetes mellitus with poor glycemic control A1c increased to 9.5% due to lack of insulin  for two months. - Sent insulin  prescriptions to pharmacy. - Resume insulin  therapy with Lantus 20 units twice a day and Novolog  7 units three times a day with meals.      Relevant Medications   insulin  aspart (NOVOLOG  FLEXPEN) 100 UNIT/ML FlexPen   insulin  glargine (LANTUS SOLOSTAR) 100 UNIT/ML Solostar Pen     Genitourinary   CKD (chronic kidney disease) stage 3, GFR 30-59 ml/min (HCC)   Management transitioned to new nephrologist. Current kidney function needs improvement for heart failure medication adjustment. - Follow up with new nephrologist at [facility]. - Monitor kidney function.        Other   B12 deficiency   Low B12 levels. Prefers monthly B12 injections. - Administered B12 injection. - Schedule monthly B12 injections.      Relevant Medications   cyanocobalamin (VITAMIN B12) injection 1,000 mcg   Constipation   Chronic constipation with ineffective over-the-counter medications. - Provided samples of Miralax for daily use. - Encouraged adequate hydration.      Relevant Orders   Ambulatory referral to Gastroenterology   Other Visit Diagnoses       Screening for colon cancer       Relevant Orders    Ambulatory referral to Gastroenterology       Return in about 3 months (around 07/24/2024) for chronic follow-up with PCP.    Leita Longs, FNP

## 2024-04-25 DIAGNOSIS — I5020 Unspecified systolic (congestive) heart failure: Secondary | ICD-10-CM | POA: Insufficient documentation

## 2024-04-25 DIAGNOSIS — K5900 Constipation, unspecified: Secondary | ICD-10-CM | POA: Insufficient documentation

## 2024-04-25 NOTE — Assessment & Plan Note (Signed)
 Low B12 levels. Prefers monthly B12 injections. - Administered B12 injection. - Schedule monthly B12 injections.

## 2024-04-25 NOTE — Assessment & Plan Note (Signed)
 Type 2 diabetes mellitus with poor glycemic control A1c increased to 9.5% due to lack of insulin  for two months. - Sent insulin  prescriptions to pharmacy. - Resume insulin  therapy with Lantus 20 units twice a day and Novolog  7 units three times a day with meals.

## 2024-04-25 NOTE — Assessment & Plan Note (Signed)
 Chronic constipation with ineffective over-the-counter medications. - Provided samples of Miralax for daily use. - Encouraged adequate hydration.

## 2024-04-25 NOTE — Assessment & Plan Note (Signed)
 Management transitioned to new nephrologist. Current kidney function needs improvement for heart failure medication adjustment. - Follow up with new nephrologist at [facility]. - Monitor kidney function.

## 2024-04-25 NOTE — Assessment & Plan Note (Signed)
 Heart failure with reduced ejection fraction Ejection fraction at 37%. Blood pressure elevated. Cardiologist to adjust medication post kidney function improvement. - Continue Entresto and nitroglycerin  as needed. - Monitor blood pressure and kidney function.

## 2024-05-02 NOTE — Progress Notes (Signed)
 Cardiology Office Note:  .   Date:  05/14/2024  ID:  [name] [name], DOB Jan 19, 1961, MRN [id number] PCP: Bevely Doffing, FNP  [facility] Providers Cardiologist:  Oneil Parchment, MD    History of Present Illness: .   [name] [name] is a 63 y.o. male  t history of hypertension, hyperlipidemia intolerant of statins, type 2 diabetes, coronary artery disease, CKD and history of hemorrhagic CVA   Echocardiogram obtained on 02/17/2022 that showed LVEF 50 to 55%, mild LVH, grade 1 DD, normal RV, trivial MR.  Carotid Doppler obtained in October 2024 showed minimal plaque.  Due to statin intolerance, patient was referred to the lipid clinic.  Prior to this visit it was reported his nephrologist has stopped his Zetia  due to renal function decline.  He was on 40 mg of daily rosuvastatin .  He was recommended for the patient is started on PCSK9 inhibitor, primary physician was initiated however does not appear patient ever started on injectable medication.  He was admitted at [facility] in November 2024 due to left retinal detachment.  CTA chest obtained on 11/11/2023 showed extensive multivessel coronary artery calcification, multiple sub-5 mm pulmonary nodules seen bilaterally, they were considered benign given their stability over time.  Lexiscan  Myoview  was completed on 01/19/2024, consistent with infarction with peri-infarct ischemia.  The LVEF was moderately reduced (30-44%) the study was intermediate risk.   Decision was made to pursue cardiac MRI to further pursue results of his abnormal stress test and not to pursue cardiac catheterization given his CKD.  Cardiac MRI was completed on 03/11/2024 showing ischemic cardiomyopathy with basal-apical inferior wall infarction involving the inferolateral wall mid-- apical with near transmural delayed enhancement suggesting no viability.  LVEF 37% and RVEF 50%.  Patient here for f/u. Crt went from 1.7 to 1.9 so renal added jardiance. Denies chest pain, dyspnea, edema,  palpitations. Walks in yard, lifting heavy things, climbing ladders, cleaning gutters. Says sugars are doing better. Hoping to drive again once cleared from [facility] for eyes.        ROS:    Studies Reviewed: SABRA         Prior CV Studies:    Cardiac MRI 03/11/2024 1. Ischemic cardiomyopathy. There is basal-apical inferior wall infarction, involving the inferolateral wall mid-apical, with near transmural delayed enhancement, suggesting no viability.   2. Normal left ventricular chamber size with moderately reduced LV systolic function, LVEF 37%.   3. Normal right ventricular chamber size and borderline normal function, RVEF 50%.   Lexiscan  Myoview  01/19/2024   Findings are consistent with infarction with peri-infarct ischemia. The study is intermediate risk.   No ST deviation was noted.   LV perfusion is abnormal. There is evidence of ischemia. There is evidence of infarction. Defect 1: There is a large defect with severe reduction in uptake present in the mid to basal inferior, inferolateral and inferoseptal location(s) that is partially reversible. Viability is present. There is abnormal wall motion in the defect area. Consistent with infarction and peri-infarct ischemia.   Left ventricular function is abnormal. Global function is moderately reduced. There was a single regional abnormality. Nuclear stress EF: 41%. The left ventricular ejection fraction is moderately decreased (30-44%). End diastolic cavity size is mildly enlarged. End systolic cavity size is mildly enlarged.   CT images were obtained for attenuation correction and were examined for the presence of coronary calcium  when appropriate.   Coronary calcium  was present on the attenuation correction CT images. Moderate coronary calcifications were present. Coronary calcifications were  present in the left anterior descending artery, left circumflex artery and right coronary artery distribution(s).   Prior study not available for  comparison.   Echocardiogram 02/17/2022 1. Left ventricular ejection fraction, by estimation, is 50 to 55%. The  left ventricle has low normal function. The left ventricle has no regional  wall motion abnormalities. There is mild left ventricular hypertrophy.  Left ventricular diastolic  parameters are consistent with Grade I diastolic dysfunction (impaired  relaxation).   2. Right ventricular systolic function is normal. The right ventricular  size is normal. Tricuspid regurgitation signal is inadequate for assessing  PA pressure.   3. The mitral valve is normal in structure. Trivial mitral valve  regurgitation. No evidence of mitral stenosis.   4. The tricuspid valve is abnormal.   5. The aortic valve is tricuspid. Aortic valve regurgitation is not  visualized. No aortic stenosis is present.     Risk Assessment/Calculations:             Physical Exam:   VS:  BP 132/80   Pulse 76   Ht 5' 11 (1.803 m)   Wt 191 lb (86.6 kg)   BMI 26.64 kg/m    Orhtostatics: No data found. Wt Readings from Last 3 Encounters:  05/14/24 191 lb (86.6 kg)  04/23/24 187 lb (84.8 kg)  04/05/24 191 lb 12.8 oz (87 kg)    GEN: Well nourished, well developed in no acute distress NECK: No JVD; No carotid bruits CARDIAC:  RRR, no murmurs, rubs, gallops RESPIRATORY:  Clear to auscultation without rales, wheezing or rhonchi  ABDOMEN: Soft, non-tender, non-distended EXTREMITIES:  No edema; No deformity   ASSESSMENT AND PLAN: .    HFrEF Ischemic cardiomyopathy Cardiac MRI 02/2024 showing ischemic cardiomyopathy with LVEF 37% with no suggestive viability - NYHA class I - Today patient appears euvolemic and well compensated on exam - Continues to maintain an active lifestyle without any limitations - GDMT optimization is limited due to CKD with creatinine 1.9 3 weeks ago by renal. They added Jardiance but he hasn't gotten it yet. - Continue Entresto  24-26 mg twice daily - Continue carvedilol  25 mg  twice daily   Coronary artery disease CT chest 10/2023 showed extensive multivessel coronary artery calcification Lexiscan  Myoview  01/2024 was consistent with infarction with peri-infarct ischemia with reduction in uptake present in the mid to basal inferior, inferolateral and inferoseptal location.  Nuclear stress EF 41% cMRI 02/2024 showing ischemic cardiomyopathy with basal-apical inferior wall infarction, involving mid inferolateral wall mid-- apical, with near transmural delayed enhancement suggesting no viability - stable without chest pains.  He remains very active without limitations or exertional symptoms - Given cardiac MRI suggesting no viability and CKD limiting inability to pursue cardiac catheterization we will continue with medical management - Continue aspirin  81 mg daily, rosuvastatin  40 mg daily, and Repatha  140 mg q. 14 days   Resistant hypertension Blood pressure stable on repeat today. Hasn't taken his meds yet. Entresto  24-26 mg twice daily, amlodipine  10 mg daily, carvedilol  25 mg twice daily, chlorthalidone  50 mg daily    Hyperlipidemia, LDL goal <55 LDL 55 on 03/2024 and controlled - Continue Repatha  140 mg q. 14 days and rosuvastatin  40 mg daily   Prior stroke Intracranial hemorrhage 4 cm in 2018     Chronic kidney disease Creatinine 1.9 when checked by nephrology on 11/12, jardiance added but patient hasn't received it yet.   T2DM A1c 9.5% on 11/2023  but doing better and scheduled for recheck this months. -  Managed on insulin , metformin , and Mounjaro        Dispo: f/u Dr. Jeffrie 6 months.  Signed, Olivia Pavy, PA-C

## 2024-05-06 ENCOUNTER — Ambulatory Visit: Admitting: Emergency Medicine

## 2024-05-14 ENCOUNTER — Ambulatory Visit: Attending: Physician Assistant | Admitting: Physician Assistant

## 2024-05-14 ENCOUNTER — Encounter: Payer: Self-pay | Admitting: Physician Assistant

## 2024-05-14 VITALS — BP 132/80 | HR 76 | Ht 71.0 in | Wt 191.0 lb

## 2024-05-14 DIAGNOSIS — I2510 Atherosclerotic heart disease of native coronary artery without angina pectoris: Secondary | ICD-10-CM | POA: Diagnosis not present

## 2024-05-14 DIAGNOSIS — N1832 Chronic kidney disease, stage 3b: Secondary | ICD-10-CM

## 2024-05-14 DIAGNOSIS — E785 Hyperlipidemia, unspecified: Secondary | ICD-10-CM

## 2024-05-14 DIAGNOSIS — Z8673 Personal history of transient ischemic attack (TIA), and cerebral infarction without residual deficits: Secondary | ICD-10-CM

## 2024-05-14 DIAGNOSIS — I255 Ischemic cardiomyopathy: Secondary | ICD-10-CM | POA: Diagnosis not present

## 2024-05-14 DIAGNOSIS — E1122 Type 2 diabetes mellitus with diabetic chronic kidney disease: Secondary | ICD-10-CM

## 2024-05-14 DIAGNOSIS — I5020 Unspecified systolic (congestive) heart failure: Secondary | ICD-10-CM

## 2024-05-14 DIAGNOSIS — I1A0 Resistant hypertension: Secondary | ICD-10-CM

## 2024-05-14 DIAGNOSIS — Z794 Long term (current) use of insulin: Secondary | ICD-10-CM

## 2024-05-14 NOTE — Patient Instructions (Signed)
 Medication Instructions:  NO CHANGES  Lab Work: NONE TO BE DONE TODAY.  Testing/Procedures: NONE  Follow-Up: At [facility], you and your health needs are our priority.  As part of our continuing mission to provide you with exceptional heart care, our providers are all part of one team.  This team includes your primary Cardiologist (physician) and Advanced Practice Providers or APPs (Physician Assistants and Nurse Practitioners) who all work together to provide you with the care you need, when you need it.  Your next appointment:   4 MONTHS  Provider:   Oneil Parchment, MD

## 2024-05-21 ENCOUNTER — Ambulatory Visit: Admitting: Gastroenterology

## 2024-06-18 ENCOUNTER — Other Ambulatory Visit (INDEPENDENT_AMBULATORY_CARE_PROVIDER_SITE_OTHER)

## 2024-06-18 ENCOUNTER — Telehealth: Payer: Self-pay | Admitting: Emergency Medicine

## 2024-06-18 ENCOUNTER — Ambulatory Visit: Admitting: Gastroenterology

## 2024-06-18 ENCOUNTER — Encounter: Payer: Self-pay | Admitting: Gastroenterology

## 2024-06-18 VITALS — BP 100/62 | HR 85 | Ht 71.0 in | Wt 185.4 lb

## 2024-06-18 DIAGNOSIS — Z860101 Personal history of adenomatous and serrated colon polyps: Secondary | ICD-10-CM | POA: Diagnosis not present

## 2024-06-18 DIAGNOSIS — R1310 Dysphagia, unspecified: Secondary | ICD-10-CM

## 2024-06-18 DIAGNOSIS — K5900 Constipation, unspecified: Secondary | ICD-10-CM | POA: Diagnosis not present

## 2024-06-18 DIAGNOSIS — K219 Gastro-esophageal reflux disease without esophagitis: Secondary | ICD-10-CM

## 2024-06-18 DIAGNOSIS — R194 Change in bowel habit: Secondary | ICD-10-CM | POA: Diagnosis not present

## 2024-06-18 DIAGNOSIS — R1319 Other dysphagia: Secondary | ICD-10-CM

## 2024-06-18 DIAGNOSIS — R634 Abnormal weight loss: Secondary | ICD-10-CM

## 2024-06-18 DIAGNOSIS — R195 Other fecal abnormalities: Secondary | ICD-10-CM | POA: Diagnosis not present

## 2024-06-18 LAB — CBC WITH DIFFERENTIAL/PLATELET
Basophils Absolute: 0 K/uL (ref 0.0–0.1)
Basophils Relative: 0.8 % (ref 0.0–3.0)
Eosinophils Absolute: 0.3 K/uL (ref 0.0–0.7)
Eosinophils Relative: 5.3 % — ABNORMAL HIGH (ref 0.0–5.0)
HCT: 40.7 % (ref 39.0–52.0)
Hemoglobin: 13.8 g/dL (ref 13.0–17.0)
Lymphocytes Relative: 32 % (ref 12.0–46.0)
Lymphs Abs: 1.5 K/uL (ref 0.7–4.0)
MCHC: 33.8 g/dL (ref 30.0–36.0)
MCV: 87.1 fl (ref 78.0–100.0)
Monocytes Absolute: 0.3 K/uL (ref 0.1–1.0)
Monocytes Relative: 6.9 % (ref 3.0–12.0)
Neutro Abs: 2.7 K/uL (ref 1.4–7.7)
Neutrophils Relative %: 55 % (ref 43.0–77.0)
Platelets: 276 K/uL (ref 150.0–400.0)
RBC: 4.67 Mil/uL (ref 4.22–5.81)
RDW: 13.8 % (ref 11.5–15.5)
WBC: 4.8 K/uL (ref 4.0–10.5)

## 2024-06-18 LAB — BASIC METABOLIC PANEL WITH GFR
BUN: 25 mg/dL — ABNORMAL HIGH (ref 6–23)
CO2: 30 meq/L (ref 19–32)
Calcium: 9.9 mg/dL (ref 8.4–10.5)
Chloride: 103 meq/L (ref 96–112)
Creatinine, Ser: 2.22 mg/dL — ABNORMAL HIGH (ref 0.40–1.50)
GFR: 31.23 mL/min — ABNORMAL LOW
Glucose, Bld: 195 mg/dL — ABNORMAL HIGH (ref 70–99)
Potassium: 4 meq/L (ref 3.5–5.1)
Sodium: 139 meq/L (ref 135–145)

## 2024-06-18 LAB — TSH: TSH: 2.31 u[IU]/mL (ref 0.35–5.50)

## 2024-06-18 MED ORDER — NA SULFATE-K SULFATE-MG SULF 17.5-3.13-1.6 GM/177ML PO SOLN: 1.0000 | Freq: Once | ORAL | 0 refills | Status: DC

## 2024-06-18 NOTE — Progress Notes (Signed)
 "  [name] [name] [id number] 1961/04/14   Chief Complaint: Change in bowel habits, constipation, gas  Referring Provider: Bevely Doffing, FNP Primary GI MD: Sampson (previous Dr. Eda)  HPI: [name] [name] is a 64 y.o. male with past medical history of CVA, CAD, diabetes, H. pylori infection, HTN, HLD who presents today for a complaint of change in bowel habits, constipation, gas.    Last colonoscopy 02/22/2022 for personal history of colon polyps.  Procedure was complicated by concern for aspiration, patient vomited approximately 1000 mL of liquid/food at time of cecal intubation and coughed the remainder of the procedure.  Note was made to consider evaluating for gastroparesis outpatient. 5-year recall recommended.  Patient is referred by PCP for evaluation of constipation, change in stool consistency, colon cancer screening.  Follows with cardiology, last visit 05/14/2024 for HFrEF, ischemic cardiomyopathy, CAD.  Cardiac MRI 02/2024 showed ischemic cardiomyopathy with LVEF 37% with no suggestive viability.  Plan is to continue medical management.  Patient is on Mounjaro.   Discussed the use of AI scribe software for clinical note transcription with the patient, who gave verbal consent to proceed.  History of Present Illness [name] [name] is a 64 year old male with prior colon polyps who presents for evaluation of constipation and altered bowel habits.  Altered bowel habits and constipation - Significant change in bowel habits over the past year. - Constipation characterized by infrequent bowel movements, sometimes up to one week without a movement followed by urgent evacuation. - Stool consistency varies between thin, stringy, and small round balls. - No normal-appearing stools for an extended period. - Metamucil gummies (one per day) have improved bowel movement frequency to every other day over the past two weeks. - No use of other laxatives or stool softeners. - Recently  started several new medications - Mounjaro dose increased to 10.2 mg three months ago, however denies change in bowel habits associated with this change, constipation was present prior to increasing dose  Dark stools - Intermittent episodes of dark stools times sometimes described as black but not always - No visible blood in the stool.  Colonic polyps and surveillance - Last colonoscopy revealed three polyps, including one precancerous lesion; one sample was unable to be processed. - No repeat colonoscopy since last exam. - Previously advised to repeat colonoscopy five years after last exam. - Upper endoscopy performed in 2020.  Dysphagia and unintentional weight loss - Difficulty swallowing present for approximately one year. - Food feels stuck in the chest about half the time when eating. - No prior esophageal dilation. - Eating less due to dysphagia. - Unintentional weight loss from 234 lbs to 184 lbs over the past year.  Gastroesophageal reflux symptoms - Heartburn primarily at night after late meals. - Symptoms relieved by Tums   Relevant medical history - History of kidney and heart problems, including blocked left ventricle. - Prior stroke in 2018. - No current use of blood thinners. - No heart attack or stroke in the past year.     Previous GI Procedures/Imaging   Colonoscopy 02/22/2022 - Non-bleeding internal hemorrhoids.  - One 3 mm polyp in the transverse colon, removed with a cold snare. Resected and retrieved.  - One 3 mm polyp in the ascending colon, removed with a cold snare. Resected and retrieved. - Concerns for aspiration.  - The examination was otherwise normal on direct and retroflexion views. - Recall 5 years Path: 1. Surgical [P], colon, ascending, polyp (1) - SPECIMEN DID NOT SURVIVE PROCESSING  2. Surgical [P], colon, transverse, polyp (1) - TUBULAR ADENOMA - NEGATIVE FOR HIGH-GRADE DYSPLASIA OR MALIGNANCY  EGD 12/21/2018 - Z- line irregular.  Biopsied.  - Gastritis. Biopsied.  - Normal examined duodenum. Biopsied.  - The examination was otherwise normal.  Past Medical History:  Diagnosis Date   CVA (cerebral vascular accident) (HCC) 2018   Diabetes (HCC)    Diabetes mellitus, type II (HCC)    H. pylori infection    Hypercholesteremia    Hypertension     Past Surgical History:  Procedure Laterality Date   EYE SURGERY Left    retina replacement sugery at [facility]   VASECTOMY  1996    Current Outpatient Medications  Medication Sig Dispense Refill   amLODipine  (NORVASC ) 10 MG tablet Take 1 tablet (10 mg total) by mouth daily. 90 tablet 1   B-D UF III MINI PEN NEEDLES 31G X 5 MM MISC as directed to skin three times a day for 90 days     carvedilol  (COREG ) 25 MG tablet Take 1 tablet (25 mg total) by mouth 2 (two) times daily. 180 tablet 3   chlorthalidone  (HYGROTON ) 50 MG tablet Take 50 mg by mouth every morning.     Continuous Glucose Sensor (FREESTYLE LIBRE 3 SENSOR) MISC 1 Units by Subdermal route every 14 (fourteen) days. Use it to check blood glucose as instructed. 6 each 2   Evolocumab  (REPATHA  SURECLICK) 140 MG/ML SOAJ Inject 140 mg into the skin every 14 (fourteen) days. 6 mL 3   insulin  aspart (NOVOLOG  FLEXPEN) 100 UNIT/ML FlexPen Inject 7 Units into the skin 3 (three) times daily with meals. 15 mL 4   insulin  glargine (LANTUS  SOLOSTAR) 100 UNIT/ML Solostar Pen Inject 20 Units into the skin 2 (two) times daily. 15 mL 5   metFORMIN  (GLUCOPHAGE ) 500 MG tablet Take 1 tablet (500 mg total) by mouth 2 (two) times daily with a meal.     MOUNJARO 10 MG/0.5ML Pen SMARTSIG:10 Milligram(s) SUB-Q Once a Week     nitroGLYCERIN  (NITROSTAT ) 0.4 MG SL tablet Place 1 tablet (0.4 mg total) under the tongue every 5 (five) minutes as needed for chest pain. Max 3 doses, if no relief after 3rd dose call 911. 25 tablet 5   rosuvastatin  (CRESTOR ) 40 MG tablet Take 1 tablet (40 mg total) by mouth daily. 90 tablet 3    sacubitril -valsartan  (ENTRESTO ) 24-26 MG Take 1 tablet by mouth 2 (two) times daily. 180 tablet 3   Current Facility-Administered Medications  Medication Dose Route Frequency Provider Last Rate Last Admin   cyanocobalamin  (VITAMIN B12) injection 1,000 mcg  1,000 mcg Intramuscular Q30 days    1,000 mcg at 04/23/24 1039    Allergies as of 06/18/2024 - Review Complete 06/18/2024  Allergen Reaction Noted   Morphine and codeine Hives 01/16/2021    Family History  Problem Relation Age of Onset   Diabetes Mother    Heart failure Mother    Colon cancer Neg Hx    Stomach cancer Neg Hx    Rectal cancer Neg Hx    Esophageal cancer Neg Hx     Social History[1]   Review of Systems:    Constitutional: Unintentional weight loss.  No fever or chills  Cardiovascular: No chest pain Respiratory: No SOB  Gastrointestinal: See HPI and otherwise negative   Physical Exam:  Vital signs: BP 100/62   Pulse 85   Ht 5' 11 (1.803 m)   Wt 185 lb 6.4 oz (84.1 kg)   BMI  25.86 kg/m   Wt Readings from Last 3 Encounters:  06/18/24 185 lb 6.4 oz (84.1 kg)  05/14/24 191 lb (86.6 kg)  04/23/24 187 lb (84.8 kg)    Constitutional: Pleasant, well-appearing male in NAD, alert and cooperative Head:  Normocephalic and atraumatic.  Respiratory: Respirations even and unlabored. Lungs clear to auscultation bilaterally.  No wheezes, crackles, or rhonchi.  Cardiovascular:  Regular rate and rhythm. No murmurs. No peripheral edema. Gastrointestinal:  Soft, nondistended, nontender. No rebound or guarding. Normal bowel sounds. No appreciable masses or hepatomegaly. Rectal:  Not performed.  Neurologic:  Alert and oriented x4;  grossly normal neurologically.  Skin:   Dry and intact without significant lesions or rashes. Psychiatric: Oriented to person, place and time. Demonstrates good judgement and reason without abnormal affect or behaviors.   Echocardiogram 02/17/2022 1. Left ventricular ejection fraction, by  estimation, is 50 to 55% . The left ventricle has low normal function. The left ventricle has no regional wall motion abnormalities. There is mild left ventricular hypertrophy. Left ventricular diastolic parameters are consistent with Grade I diastolic dysfunction ( impaired relaxation) .  2. Right ventricular systolic function is normal. The right ventricular size is normal. Tricuspid regurgitation signal is inadequate for assessing PA pressure.  3. The mitral valve is normal in structure. Trivial mitral valve regurgitation. No evidence of mitral stenosis.  4. The tricuspid valve is abnormal.  5. The aortic valve is tricuspid. Aortic valve regurgitation is not visualized. No aortic stenosis is present.   Assessment/Plan:   Assessment & Plan Change in bowel habits Constipation Change in stool caliber History of adenomatous colon polyps Patient with about 1 year of change in bowel habits with constipation, change in stool caliber, frequently having thin stools or hard balls of stool.  In the context of prior adenomatous colon polyps, raises concern for recurrent neoplasia or other colonic pathology.  - Schedule colonoscopy. I thoroughly discussed the procedure with the patient to include nature of the procedure, alternatives, benefits, and risks (including but not limited to bleeding, infection, perforation, anesthesia/cardiac/pulmonary complications). Patient verbalized understanding and gave verbal consent to proceed with procedure.  - Plan for procedures in hospital setting based on patient's cardiac history (recent cardiac MRI showing ejection fraction of 37%), and previous concern for aspiration on last colonoscopy. - Request cardiac clearance for procedure - Labs today: CBC, BMP, TSH, TTG, IgA - Continue fiber supplementation  Esophageal dysphagia Unintentional weight loss Intermittent dark stools GERD Patient reports about 1 year of intermittent dysphagia to solids, feels food gets  stuck in his chest.  Due to his symptoms have been eating less and endorses unintentional weight loss.  Per chart review has lost about 20 pounds over the course of the last year. Intermittent nocturnal GERD symptoms responsive to antacids, possibly contributing to upper GI symptoms and to be further evaluated during endoscopy. Endorses intermittent dark stools, sometimes describes black over the last year.  - Schedule upper endoscopy to be done with colonoscopy. - Consider esophageal dilation during EGD if a stricture is identified. - Continue antacids as needed - If concern for anemia on labs today may start PPI   Camie Furbish, PA-C [facility] 06/18/2024, 10:31 AM  Patient Care Team: Bevely Doffing, FNP as PCP - General (Family Medicine) Jeffrie Oneil BROCKS, MD as PCP - Cardiology (Cardiology)       [1]  Social History Tobacco Use   Smoking status: Never   Smokeless tobacco: Never  Vaping Use   Vaping status: Never  Used  Substance Use Topics   Alcohol use: Not Currently    Comment: occasionally   Drug use: Never   "

## 2024-06-18 NOTE — Telephone Encounter (Signed)
 [facility] Pre-operative Risk Assessment     Request for surgical clearance:     Endoscopy Procedure  What type of surgery is being performed?     Endoscopy / Colonoscopy  When is this surgery scheduled?     07/22/2024  What type of clearance is required ?   Medical  Are there any medications that need to be held prior to surgery and how long? No, would like to know if patient can be cleared for procedure  Practice name and name of physician performing surgery?      [facility]  What is your office phone and fax number?      Phone- [phone]  Fax- [phone]  Anesthesia type (None, local, MAC, general) ?       MAC   Please route your response to Percy Winterrowd CMA

## 2024-06-18 NOTE — Telephone Encounter (Signed)
 Will update all parties pt has appt with Dr. Jeffrie 07/05/24. Per preop APP preop clearance to be addressed at appt 07/05/24.    PROCEDURE TO BE DONE WITH DR. HENRY DANIS

## 2024-06-18 NOTE — Patient Instructions (Addendum)
 Please go to the lab in the basement of our building to have lab work done as you leave today. Hit B for basement when you get on the elevator.  When the doors open the lab is on your left.  We will call you with the results. Thank you.   You have been scheduled for an endoscopy and colonoscopy. Please follow the written instructions given to you at your visit today.  If you use inhalers (even only as needed), please bring them with you on the day of your procedure.  DO NOT TAKE 7 DAYS PRIOR TO TEST- Trulicity (dulaglutide) Ozempic, Wegovy (semaglutide) Mounjaro, Zepbound (tirzepatide) Bydureon Bcise (exanatide extended release)  DO NOT TAKE 1 DAY PRIOR TO YOUR TEST Rybelsus (semaglutide) Adlyxin (lixisenatide) Victoza (liraglutide) Byetta (exanatide)  We have sent the following medications to your pharmacy for you to pick up at your convenience: Suprep  _______________________________________________________  If your blood pressure at your visit was 140/90 or greater, please contact your primary care physician to follow up on this.  _______________________________________________________  If you are age 56 or older, your body mass index should be between 23-30. Your Body mass index is 25.86 kg/m. If this is out of the aforementioned range listed, please consider follow up with your Primary Care Provider.  If you are age 53 or younger, your body mass index should be between 19-25. Your Body mass index is 25.86 kg/m. If this is out of the aformentioned range listed, please consider follow up with your Primary Care Provider.   ________________________________________________________  The [facility] providers would like to encourage you to use MYCHART to communicate with providers for non-urgent requests or questions.  Due to long hold times on the telephone, sending your provider a message by [facility] may be a faster and more efficient way to get a response.  Please allow 48  business hours for a response.  Please remember that this is for non-urgent requests.  _______________________________________________________  [facility] is using a team-based approach to care.  Your team is made up of your doctor and two to three APPS. Our APPS (Nurse Practitioners and Physician Assistants) work with your physician to ensure care continuity for you. They are fully qualified to address your health concerns and develop a treatment plan. They communicate directly with your gastroenterologist to care for you. Seeing the Advanced Practice Practitioners on your physician's team can help you by facilitating care more promptly, often allowing for earlier appointments, access to diagnostic testing, procedures, and other specialty referrals.    Due to recent changes in healthcare laws, you may see the results of your imaging and laboratory studies on MyChart before your provider has had a chance to review them.  We understand that in some cases there may be results that are confusing or concerning to you. Not all laboratory results come back in the same time frame and the provider may be waiting for multiple results in order to interpret others.  Please give us  48 hours in order for your provider to thoroughly review all the results before contacting the office for clarification of your results.   ___________________________________________________________________________

## 2024-06-18 NOTE — Telephone Encounter (Signed)
" ° °  Name: [name] [name]  DOB: Apr 05, 1961  MRN: [id number]  Primary Cardiologist: Oneil Parchment, MD  Chart reviewed as part of pre-operative protocol coverage. The patient has an upcoming visit scheduled with Dr. Parchment on 07/05/24 at which time clearance can be addressed in case there are any issues that would impact surgical recommendations.  Endoscopy/colonoscopy is not scheduled until 07/22/24 as below. I added preop FYI to appointment note so that provider is aware to address at time of outpatient visit.  Per office protocol the cardiology provider should forward their finalized clearance decision and recommendations regarding antiplatelet therapy to the requesting party below.    I will route this message as FYI to requesting party and remove this message from the preop box as separate preop APP input not needed at this time.   Please call with any questions.  Candee Hoon D Cecilia Vancleve, NP  06/18/2024, 12:00 PM   "

## 2024-06-19 LAB — TISSUE TRANSGLUTAMINASE ABS,IGG,IGA
(tTG) Ab, IgA: <1 U/mL
(tTG) Ab, IgG: <1 U/mL

## 2024-06-19 LAB — IGA: Immunoglobulin A: 285 mg/dL (ref 70–320)

## 2024-06-20 ENCOUNTER — Ambulatory Visit: Payer: Self-pay | Admitting: Gastroenterology

## 2024-06-21 ENCOUNTER — Telehealth: Payer: Self-pay | Admitting: Gastroenterology

## 2024-06-21 DIAGNOSIS — Z8719 Personal history of other diseases of the digestive system: Secondary | ICD-10-CM

## 2024-06-21 DIAGNOSIS — Z860100 Personal history of colon polyps, unspecified: Secondary | ICD-10-CM

## 2024-06-21 MED ORDER — PEG 3350-KCL-NA BICARB-NACL 420 G PO SOLR: 4000.0000 mL | Freq: Once | ORAL | 0 refills | Status: AC

## 2024-06-21 NOTE — Telephone Encounter (Signed)
 Spoke with patient regarding change in prep per Dr Legrand' instructions. Patient verbalized understanding and read instructions on MyChart. New prescription sent in for Golytely.   Patient voiced understanding and denied further questions or concerns.

## 2024-06-21 NOTE — Telephone Encounter (Signed)
 Per Dr. Legrand, please change patient's prep for colonoscopy from Suprep to Dulcolax plus Golytely based on his constipation, thank you.

## 2024-06-21 NOTE — Progress Notes (Signed)
 ____________________________________________________________  Attending physician addendum:  Thank you for sending this case to me. I have reviewed the entire note and agree with the plan. I also reviewed his previous endoscopic reports and additional records.  Agree with procedures in the hospital outpatient endoscopy lab.  Victory Brand, MD  ____________________________________________________________

## 2024-06-22 ENCOUNTER — Other Ambulatory Visit: Payer: Self-pay

## 2024-07-05 ENCOUNTER — Ambulatory Visit: Attending: Cardiology | Admitting: Cardiology

## 2024-07-05 VITALS — BP 130/78 | HR 80 | Ht 71.0 in | Wt 189.0 lb

## 2024-07-05 DIAGNOSIS — I10 Essential (primary) hypertension: Secondary | ICD-10-CM

## 2024-07-05 DIAGNOSIS — Z01812 Encounter for preprocedural laboratory examination: Secondary | ICD-10-CM

## 2024-07-05 DIAGNOSIS — I5020 Unspecified systolic (congestive) heart failure: Secondary | ICD-10-CM

## 2024-07-05 DIAGNOSIS — I2510 Atherosclerotic heart disease of native coronary artery without angina pectoris: Secondary | ICD-10-CM

## 2024-07-05 DIAGNOSIS — R072 Precordial pain: Secondary | ICD-10-CM | POA: Diagnosis not present

## 2024-07-05 DIAGNOSIS — I2583 Coronary atherosclerosis due to lipid rich plaque: Secondary | ICD-10-CM

## 2024-07-05 DIAGNOSIS — I1A0 Resistant hypertension: Secondary | ICD-10-CM

## 2024-07-05 DIAGNOSIS — N1832 Chronic kidney disease, stage 3b: Secondary | ICD-10-CM

## 2024-07-05 DIAGNOSIS — E119 Type 2 diabetes mellitus without complications: Secondary | ICD-10-CM

## 2024-07-05 DIAGNOSIS — I255 Ischemic cardiomyopathy: Secondary | ICD-10-CM | POA: Diagnosis not present

## 2024-07-05 LAB — CBC

## 2024-07-05 NOTE — H&P (View-Only) (Signed)
 " Cardiology Office Note:  .   Date:  07/05/2024  ID:  [name] [name], DOB 01-12-1961, MRN [id number] PCP: Bevely Doffing, FNP  [facility] Providers Cardiologist:  Oneil Parchment, MD    History of Present Illness: .   [name] [name] is a 64 y.o. male Discussed the use of AI scribe   History of Present Illness [name] [name] is a 64 year old male with ischemic cardiomyopathy who presents for follow-up on cardiac health.  Ischemic cardiomyopathy and cardiac function - Cardiac MRI in September 2025 revealed ischemic cardiomyopathy with basal apical inferior wall infarction involving the inferior lateral wall, mid apical with near transmural delayed enhancement. - Ejection fraction measured at 37%. - Nuclear stress test in August 2025 showed infarction area with peri-infarct ischemia. - MRI confirmed transmural infarction affecting the full thickness of the myocardium. - CT chest in May 2025 demonstrated extensive multivessel coronary artery calcification. - Cardiac catheterization has not been performed due to concerns about kidney function and risk of dialysis. - No chest pain, shortness of breath, or other cardiac symptoms.  Renal dysfunction - Creatinine increased from 1.9 to 2.2, indicating declining kidney function. - Renal impairment attributed to diabetes and cardiac dysfunction. - Jardiance was recently reintroduced by nephrology.  Diabetes mellitus - Hemoglobin A1c measured at 9.5 in June 2025. - Current medications include insulin , metformin , and Mounjaro.  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 - Current medications include carvedilol  25 mg twice daily, chlorthalidone  50 mg, Repatha , Crestor  40 mg, Entresto  24/26 mg, and Jardiance.   Wife present.  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   ROS: No recent chest pain or shortness of breath.  Studies Reviewed: SABRA   EKG Interpretation Date/Time:  Friday  July 05 2024 07:56:20 EST Ventricular Rate:  80 PR Interval:  148 QRS Duration:  100 QT Interval:  344 QTC Calculation: 396 R Axis:   25  Text Interpretation: Normal sinus rhythm Possible Inferior infarct (cited on or before 01-Mar-2023) When compared with ECG of 01-Mar-2023 08:51, ST no longer depressed in Inferior leads Nonspecific T wave abnormality has replaced inverted T waves in Inferior leads T wave inversion no longer evident in Anterior leads Elevation more prominent in ant leads but seen previously Confirmed by Parchment Oneil ([id number]) on 07/05/2024 8:22:46 AM    Results Labs Hemoglobin A1c (June 2025): 9.5 LDL cholesterol (October 2025): 50 Creatinine: 2.2, previously 1.9  Radiology Cardiac MRI (September 2025): Ischemic cardiomyopathy with basal and apical inferior wall infarction involving the inferolateral wall, mid-apical with near transmural delayed enhancement indicating nonviable myocardium; ejection fraction 37% Chest CT (May 2025): Extensive multivessel coronary artery calcification  Diagnostic Nuclear stress test (August 2025): Infarction with peri-infarct ischemia Risk Assessment/Calculations:            Physical Exam:   VS:  BP 130/78 (BP Location: Left Arm, Patient Position: Sitting, Cuff Size: Normal)   Pulse 80   Ht 5' 11 (1.803 m)   Wt 189 lb (85.7 kg)   SpO2 98%   BMI 26.36 kg/m    Wt Readings from Last 3 Encounters:  07/05/24 189 lb (85.7 kg)  06/18/24 185 lb 6.4 oz (84.1 kg)  05/14/24 191 lb (86.6 kg)    GEN: Well nourished, well developed in no acute distress NECK: No JVD; No carotid bruits CARDIAC: RRR, no murmurs, no rubs, no gallops RESPIRATORY:  Clear to auscultation without rales, wheezing or rhonchi  ABDOMEN: Soft, non-tender, non-distended EXTREMITIES:  No edema; No deformity   ASSESSMENT AND PLAN: .    Assessment and Plan Assessment & Plan Ischemic cardiomyopathy with prior myocardial infarction Ischemic cardiomyopathy with  basal apical inferior wall infarction involving the inferior lateral wall, mid apical with near transmural delayed enhancement suggesting no viability. EF is 37%. No current symptoms of chest pain or shortness of breath. Potential for significant coronary artery disease in the anterior circulation not fully assessed due to kidney function concerns. - Will consult with a cardiologist specializing in cardiac catheterization to assess coronary anatomy. - Will consider cardiac catheterization with minimal contrast and pre-procedure hydration to minimize kidney risk. - Continue current medications including carvedilol , Entresto , and Jardiance.  Chronic multivessel coronary artery disease Extensive multivessel coronary artery calcification noted on CT in May 2025. Potential for significant stenosis in the anterior circulation not fully assessed due to kidney function concerns. - Will consider cardiac catheterization to assess coronary anatomy and potential need for stenting or bypass surgery.  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 Risk of contrast-induced nephropathy with cardiac catheterization discussed. Potential for kidney function to worsen post-procedure, but not guaranteed. - Will consider cardiac catheterization with minimal contrast and pre-procedure hydration to minimize kidney risk. - Continue monitoring kidney function.  Type 2 diabetes mellitus, poorly controlled Type 2 diabetes with hemoglobin A1c of 9.5 in June 2025. Currently on insulin , metformin , and Mounjaro. Diabetes contributing to chronic kidney disease. - Continue current diabetes management regimen including insulin , metformin , and Mounjaro.  Hyperlipidemia LDL cholesterol at 50 in October 2024, well-controlled with Repatha  and Crestor . Aim to stabilize cardiac condition and prevent future  myocardial infarctions. - Continue Repatha  and Crestor  for lipid management.  Spoke with interventional colleague.  I think we can do this with dye sparing procedure and minimize potential risk for worsening acute kidney injury.  We will hold the Entresto  and Jardiance prior.  2-hour fluid protocol prior as well.     Informed Consent   Shared Decision Making/Informed Consent The risks [stroke (1 in 1000), death (1 in 1000), kidney failure [usually temporary] (1 in 500), bleeding (1 in 200), allergic reaction [possibly serious] (1 in 200)], benefits (diagnostic support and management of coronary artery disease) and alternatives of a cardiac catheterization were discussed in detail with Mr. [name] and he is willing to proceed.     Dispo: Post cath follow-up  Signed, Oneil Parchment, MD   "

## 2024-07-05 NOTE — Patient Instructions (Addendum)
 Medication Instructions:  The current medical regimen is effective;  continue present plan and medications.  *If you need a refill on your cardiac medications before your next appointment, please call your pharmacy*  Lab Work: Please have blood work today (CBC, BMP)  If you have labs (blood work) drawn today and your tests are completely normal, you will receive your results only by: MyChart Message (if you have MyChart) OR A paper copy in the mail If you have any lab test that is abnormal or we need to change your treatment, we will call you to review the results.  Testing/Procedures:  [facility] A DEPT OF [facility] [address] A DEPT OF THE [place] [address] [place] [address] Dept: [phone] Loc: [phone]  [name] [name]  07/05/2024  You are scheduled for a Cardiac Catheterization on Monday, February 2 with Dr. Peter Jordan.  1. Please arrive at the [facility] (Main Entrance A) at [facility]: [address] [place], [place] [address] at 6:30 AM (This time is 2.5 hour(s) before your procedure to ensure your preparation).   Free valet parking service is available. You will check in at ADMITTING. The support person will be asked to wait in the waiting room.  It is OK to have someone drop you off and come back when you are ready to be discharged.    Special note: Every effort is made to have your procedure done on time. Please understand that emergencies sometimes delay scheduled procedures.  2. Diet: Nothing to eat after midnight.   3. Hydration: On February 2, you may drink approved liquids (see below) until 2 hours before the procedure with 8 oz of water as your last intake.   List of approved liquids water, clear juice, clear tea, black coffee, fruit juices, non-citric and without pulp, carbonated beverages, Gatorade, Kool -Aid, plain Jello-O and plain ice popsicles.  4. Labs: You will need to  have blood drawn on 07/05/24.  5. Medication instructions in preparation for your procedure:   Contrast Allergy: No   HOLD: Entresto  and Jardiance the day before your procedure (last dose on 07/13/2024)  HOLD: Mounjaro week of 07/08/24  Take only 10 units of insulin  the night before your procedure. Do not take any insulin  on the day of the procedure. If you do not take Lantus  at bedtime, hold this insulin  on 07/15/24.  Do not take Diabetes Med Glucophage  (Metformin ) on the day of the procedure and HOLD 48 HOURS AFTER THE PROCEDURE.  On the morning of your procedure, take your Aspirin  81 mg and any morning medicines NOT listed above.  You may use sips of water.  6. Plan to go home the same day, you will only stay overnight if medically necessary. 7. Bring a current list of your medications and current insurance cards. 8. You MUST have a responsible person to drive you home. 9. Someone MUST be with you the first 24 hours after you arrive home or your discharge will be delayed. 10. Please wear clothes that are easy to get on and off and wear slip-on shoes.  Thank you for allowing us  to care for you!   -- [facility]   Follow-Up: At [facility], you and your health needs are our priority.  As part of our continuing mission to provide you with exceptional heart care, our providers are all part of one team.  This team  includes your primary Cardiologist (physician) and Advanced Practice Providers or APPs (Physician Assistants and Nurse Practitioners) who all work together to provide you with the care you need, when you need it.  Your next appointment:   Follow up to be determined after heart cath.   We recommend signing up for the patient portal called MyChart.  Sign up information is provided on this After Visit Summary.  MyChart is used to connect with patients for Virtual Visits (Telemedicine).  Patients are able to view lab/test results, encounter  notes, upcoming appointments, etc.  Non-urgent messages can be sent to your provider as well.   To learn more about what you can do with MyChart, go to [email or web address].

## 2024-07-05 NOTE — Progress Notes (Signed)
 " Cardiology Office Note:  .   Date:  07/05/2024  ID:  [name] [name], DOB 01-12-1961, MRN [id number] PCP: Bevely Doffing, FNP  [facility] Providers Cardiologist:  Oneil Parchment, MD    History of Present Illness: .   [name] [name] is a 64 y.o. male Discussed the use of AI scribe   History of Present Illness [name] [name] is a 64 year old male with ischemic cardiomyopathy who presents for follow-up on cardiac health.  Ischemic cardiomyopathy and cardiac function - Cardiac MRI in September 2025 revealed ischemic cardiomyopathy with basal apical inferior wall infarction involving the inferior lateral wall, mid apical with near transmural delayed enhancement. - Ejection fraction measured at 37%. - Nuclear stress test in August 2025 showed infarction area with peri-infarct ischemia. - MRI confirmed transmural infarction affecting the full thickness of the myocardium. - CT chest in May 2025 demonstrated extensive multivessel coronary artery calcification. - Cardiac catheterization has not been performed due to concerns about kidney function and risk of dialysis. - No chest pain, shortness of breath, or other cardiac symptoms.  Renal dysfunction - Creatinine increased from 1.9 to 2.2, indicating declining kidney function. - Renal impairment attributed to diabetes and cardiac dysfunction. - Jardiance was recently reintroduced by nephrology.  Diabetes mellitus - Hemoglobin A1c measured at 9.5 in June 2025. - Current medications include insulin , metformin , and Mounjaro.  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 - Current medications include carvedilol  25 mg twice daily, chlorthalidone  50 mg, Repatha , Crestor  40 mg, Entresto  24/26 mg, and Jardiance.   Wife present.  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   ROS: No recent chest pain or shortness of breath.  Studies Reviewed: SABRA   EKG Interpretation Date/Time:  Friday  July 05 2024 07:56:20 EST Ventricular Rate:  80 PR Interval:  148 QRS Duration:  100 QT Interval:  344 QTC Calculation: 396 R Axis:   25  Text Interpretation: Normal sinus rhythm Possible Inferior infarct (cited on or before 01-Mar-2023) When compared with ECG of 01-Mar-2023 08:51, ST no longer depressed in Inferior leads Nonspecific T wave abnormality has replaced inverted T waves in Inferior leads T wave inversion no longer evident in Anterior leads Elevation more prominent in ant leads but seen previously Confirmed by Parchment Oneil ([id number]) on 07/05/2024 8:22:46 AM    Results Labs Hemoglobin A1c (June 2025): 9.5 LDL cholesterol (October 2025): 50 Creatinine: 2.2, previously 1.9  Radiology Cardiac MRI (September 2025): Ischemic cardiomyopathy with basal and apical inferior wall infarction involving the inferolateral wall, mid-apical with near transmural delayed enhancement indicating nonviable myocardium; ejection fraction 37% Chest CT (May 2025): Extensive multivessel coronary artery calcification  Diagnostic Nuclear stress test (August 2025): Infarction with peri-infarct ischemia Risk Assessment/Calculations:            Physical Exam:   VS:  BP 130/78 (BP Location: Left Arm, Patient Position: Sitting, Cuff Size: Normal)   Pulse 80   Ht 5' 11 (1.803 m)   Wt 189 lb (85.7 kg)   SpO2 98%   BMI 26.36 kg/m    Wt Readings from Last 3 Encounters:  07/05/24 189 lb (85.7 kg)  06/18/24 185 lb 6.4 oz (84.1 kg)  05/14/24 191 lb (86.6 kg)    GEN: Well nourished, well developed in no acute distress NECK: No JVD; No carotid bruits CARDIAC: RRR, no murmurs, no rubs, no gallops RESPIRATORY:  Clear to auscultation without rales, wheezing or rhonchi  ABDOMEN: Soft, non-tender, non-distended EXTREMITIES:  No edema; No deformity   ASSESSMENT AND PLAN: .    Assessment and Plan Assessment & Plan Ischemic cardiomyopathy with prior myocardial infarction Ischemic cardiomyopathy with  basal apical inferior wall infarction involving the inferior lateral wall, mid apical with near transmural delayed enhancement suggesting no viability. EF is 37%. No current symptoms of chest pain or shortness of breath. Potential for significant coronary artery disease in the anterior circulation not fully assessed due to kidney function concerns. - Will consult with a cardiologist specializing in cardiac catheterization to assess coronary anatomy. - Will consider cardiac catheterization with minimal contrast and pre-procedure hydration to minimize kidney risk. - Continue current medications including carvedilol , Entresto , and Jardiance.  Chronic multivessel coronary artery disease Extensive multivessel coronary artery calcification noted on CT in May 2025. Potential for significant stenosis in the anterior circulation not fully assessed due to kidney function concerns. - Will consider cardiac catheterization to assess coronary anatomy and potential need for stenting or bypass surgery.  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 Risk of contrast-induced nephropathy with cardiac catheterization discussed. Potential for kidney function to worsen post-procedure, but not guaranteed. - Will consider cardiac catheterization with minimal contrast and pre-procedure hydration to minimize kidney risk. - Continue monitoring kidney function.  Type 2 diabetes mellitus, poorly controlled Type 2 diabetes with hemoglobin A1c of 9.5 in June 2025. Currently on insulin , metformin , and Mounjaro. Diabetes contributing to chronic kidney disease. - Continue current diabetes management regimen including insulin , metformin , and Mounjaro.  Hyperlipidemia LDL cholesterol at 50 in October 2024, well-controlled with Repatha  and Crestor . Aim to stabilize cardiac condition and prevent future  myocardial infarctions. - Continue Repatha  and Crestor  for lipid management.  Spoke with interventional colleague.  I think we can do this with dye sparing procedure and minimize potential risk for worsening acute kidney injury.  We will hold the Entresto  and Jardiance prior.  2-hour fluid protocol prior as well.     Informed Consent   Shared Decision Making/Informed Consent The risks [stroke (1 in 1000), death (1 in 1000), kidney failure [usually temporary] (1 in 500), bleeding (1 in 200), allergic reaction [possibly serious] (1 in 200)], benefits (diagnostic support and management of coronary artery disease) and alternatives of a cardiac catheterization were discussed in detail with Mr. [name] and he is willing to proceed.     Dispo: Post cath follow-up  Signed, Oneil Parchment, MD   "

## 2024-07-06 ENCOUNTER — Other Ambulatory Visit: Payer: Self-pay | Admitting: Physician Assistant

## 2024-07-06 ENCOUNTER — Ambulatory Visit: Payer: Self-pay | Admitting: Cardiology

## 2024-07-06 LAB — BASIC METABOLIC PANEL WITH GFR
BUN/Creatinine Ratio: 11 (ref 10–24)
BUN: 23 mg/dL (ref 8–27)
CO2: 26 mmol/L (ref 20–29)
Calcium: 10.1 mg/dL (ref 8.6–10.2)
Chloride: 96 mmol/L (ref 96–106)
Creatinine, Ser: 2.07 mg/dL — ABNORMAL HIGH (ref 0.76–1.27)
Glucose: 88 mg/dL (ref 70–99)
Potassium: 4.2 mmol/L (ref 3.5–5.2)
Sodium: 136 mmol/L (ref 134–144)
eGFR: 36 mL/min/{1.73_m2} — ABNORMAL LOW

## 2024-07-06 LAB — CBC
Hematocrit: 43 % (ref 37.5–51.0)
Hemoglobin: 14 g/dL (ref 13.0–17.7)
MCH: 29.2 pg (ref 26.6–33.0)
MCHC: 32.6 g/dL (ref 31.5–35.7)
MCV: 90 fL (ref 79–97)
Platelets: 308 10*3/uL (ref 150–450)
RBC: 4.8 x10E6/uL (ref 4.14–5.80)
RDW: 13.4 % (ref 11.6–15.4)
WBC: 5.2 10*3/uL (ref 3.4–10.8)

## 2024-07-09 ENCOUNTER — Encounter (HOSPITAL_COMMUNITY): Payer: Self-pay | Admitting: Gastroenterology

## 2024-07-11 ENCOUNTER — Telehealth: Payer: Self-pay | Admitting: *Deleted

## 2024-07-11 NOTE — Telephone Encounter (Signed)
 Cardiac Catheterization scheduled at [facility] for: Monday July 15, 2024 9 AM Arrival time [facility] Main Entrance A at: 6:30 AM-to allow time for ~ 2 hours pre-procedure hydration per Dr Jeffrie Per Dr Emmett flow rate for hydration-64ml/kg/hr for 1 hour, then 1 ml/kg/hr continuous  Diet: -Nothing to eat after midnight.  Hydration: -May drink clear liquids until 2 hours before the procedure.  Approved liquids: Water, clear tea, black coffee, fruit juices-non-citric and without pulp,Gatorade, plain Jello/popsicles.  No PO hydration (bottle of water) -going in early for IVF   Medication instructions: -Hold:  Insulin -AM of procedure/1/2 usual Insulin  dose HS prior to procedure  Metformin -day of procedure and 48 hours after   Chlorthalidone /Entresto -day before and day of procedure-per protocol GFR < 60 (36)  Jardiance-AM of procedure   -Other usual morning medications can be taken including aspirin  81 mg.  Plan to go home the same day, you will only stay overnight if medically necessary.  You must have responsible adult to drive you home.  Someone must be with you the first 24 hours after you arrive home.  Reviewed procedure instructions with patient.

## 2024-07-15 ENCOUNTER — Ambulatory Visit (HOSPITAL_COMMUNITY)
Admission: RE | Admit: 2024-07-15 | Discharge: 2024-07-15 | Disposition: A | Discharge status: Home / Self Care | Attending: Cardiology | Admitting: Cardiology

## 2024-07-15 ENCOUNTER — Other Ambulatory Visit: Payer: Self-pay

## 2024-07-15 ENCOUNTER — Encounter (HOSPITAL_COMMUNITY)
Admission: RE | Disposition: A | Discharge status: Home / Self Care | Payer: Self-pay | Source: Home / Self Care | Attending: Cardiology

## 2024-07-15 DIAGNOSIS — Z7985 Long-term (current) use of injectable non-insulin antidiabetic drugs: Secondary | ICD-10-CM | POA: Insufficient documentation

## 2024-07-15 DIAGNOSIS — I2510 Atherosclerotic heart disease of native coronary artery without angina pectoris: Secondary | ICD-10-CM | POA: Insufficient documentation

## 2024-07-15 DIAGNOSIS — I2582 Chronic total occlusion of coronary artery: Secondary | ICD-10-CM | POA: Insufficient documentation

## 2024-07-15 DIAGNOSIS — I252 Old myocardial infarction: Secondary | ICD-10-CM | POA: Insufficient documentation

## 2024-07-15 DIAGNOSIS — Z79899 Other long term (current) drug therapy: Secondary | ICD-10-CM | POA: Insufficient documentation

## 2024-07-15 DIAGNOSIS — E1165 Type 2 diabetes mellitus with hyperglycemia: Secondary | ICD-10-CM | POA: Insufficient documentation

## 2024-07-15 DIAGNOSIS — I255 Ischemic cardiomyopathy: Secondary | ICD-10-CM | POA: Insufficient documentation

## 2024-07-15 DIAGNOSIS — E1159 Type 2 diabetes mellitus with other circulatory complications: Secondary | ICD-10-CM

## 2024-07-15 DIAGNOSIS — E785 Hyperlipidemia, unspecified: Secondary | ICD-10-CM | POA: Insufficient documentation

## 2024-07-15 DIAGNOSIS — Z7984 Long term (current) use of oral hypoglycemic drugs: Secondary | ICD-10-CM | POA: Insufficient documentation

## 2024-07-15 DIAGNOSIS — E1122 Type 2 diabetes mellitus with diabetic chronic kidney disease: Secondary | ICD-10-CM | POA: Insufficient documentation

## 2024-07-15 DIAGNOSIS — Z794 Long term (current) use of insulin: Secondary | ICD-10-CM | POA: Insufficient documentation

## 2024-07-15 DIAGNOSIS — N1831 Chronic kidney disease, stage 3a: Secondary | ICD-10-CM | POA: Insufficient documentation

## 2024-07-15 LAB — GLUCOSE, CAPILLARY: Glucose-Capillary: 280 mg/dL — ABNORMAL HIGH (ref 70–99)

## 2024-07-15 MED ORDER — FENTANYL CITRATE (PF) 100 MCG/2ML IJ SOLN
INTRAMUSCULAR | Status: DC | PRN
  Administered 2024-07-15: 25 ug via INTRAVENOUS

## 2024-07-15 MED ORDER — HYDRALAZINE HCL 20 MG/ML IJ SOLN
INTRAMUSCULAR | Status: DC | PRN
  Administered 2024-07-15: 10 mg via INTRAVENOUS

## 2024-07-15 MED ORDER — MIDAZOLAM HCL 2 MG/2ML IJ SOLN
INTRAMUSCULAR | Status: AC
  Filled 2024-07-15: qty 2

## 2024-07-15 MED ORDER — ASPIRIN 81 MG PO CHEW
81.0000 mg | CHEWABLE_TABLET | ORAL | Status: AC
  Administered 2024-07-15: 81 mg via ORAL

## 2024-07-15 MED ORDER — VERAPAMIL HCL 2.5 MG/ML IV SOLN
INTRAVENOUS | Status: AC
  Filled 2024-07-15: qty 2

## 2024-07-15 MED ORDER — MIDAZOLAM HCL (PF) 2 MG/2ML IJ SOLN
INTRAMUSCULAR | Status: DC | PRN
  Administered 2024-07-15: 2 mg via INTRAVENOUS

## 2024-07-15 MED ORDER — FENTANYL CITRATE (PF) 100 MCG/2ML IJ SOLN
INTRAMUSCULAR | Status: AC
  Filled 2024-07-15: qty 2

## 2024-07-15 MED ORDER — HEPARIN SODIUM (PORCINE) 1000 UNIT/ML IJ SOLN
INTRAMUSCULAR | Status: DC | PRN
  Administered 2024-07-15: 4000 [IU] via INTRAVENOUS

## 2024-07-15 MED ORDER — LIDOCAINE HCL (PF) 1 % IJ SOLN
INTRAMUSCULAR | Status: DC | PRN
  Administered 2024-07-15: 2 mL

## 2024-07-15 MED ORDER — METFORMIN HCL 500 MG PO TABS: 500.0000 mg | ORAL_TABLET | Freq: Two times a day (BID) | ORAL | Status: AC

## 2024-07-15 MED ORDER — HEPARIN (PORCINE) IN NACL 1000-0.9 UT/500ML-% IV SOLN
INTRAVENOUS | Status: DC | PRN
  Administered 2024-07-15: 1000 mL via SURGICAL_CAVITY

## 2024-07-15 MED ORDER — SODIUM CHLORIDE 0.9 % IV SOLN: INTRAVENOUS | Status: DC

## 2024-07-15 MED ORDER — LIDOCAINE HCL (PF) 1 % IJ SOLN
INTRAMUSCULAR | Status: AC
  Filled 2024-07-15: qty 30

## 2024-07-15 MED ORDER — VERAPAMIL HCL 2.5 MG/ML IV SOLN
INTRAVENOUS | Status: DC | PRN
  Administered 2024-07-15: 10 mL via INTRA_ARTERIAL

## 2024-07-15 MED ORDER — ASPIRIN 81 MG PO CHEW
CHEWABLE_TABLET | ORAL | Status: AC
  Filled 2024-07-15: qty 1

## 2024-07-15 MED ORDER — HYDRALAZINE HCL 20 MG/ML IJ SOLN
INTRAMUSCULAR | Status: AC
  Filled 2024-07-15: qty 1

## 2024-07-15 MED ORDER — HEPARIN SODIUM (PORCINE) 1000 UNIT/ML IJ SOLN
INTRAMUSCULAR | Status: AC
  Filled 2024-07-15: qty 10

## 2024-07-15 MED ORDER — IOHEXOL 350 MG/ML SOLN
INTRAVENOUS | Status: DC | PRN
  Administered 2024-07-15: 40 mL via INTRA_ARTERIAL

## 2024-07-15 NOTE — Discharge Instructions (Signed)
 Radial Site Care The following information offers guidance on how to care for yourself after your procedure. Your health care provider may also give you more specific instructions. If you have problems or questions, contact your health care provider. What can I expect after the procedure? After the procedure, it is common to have bruising and tenderness in the incision area. Follow these instructions at home: Incision site care  Follow instructions from your health care provider about how to take care of your incision site. Make sure you: Wash your hands with soap and water for at least 20 seconds before and after you change your bandage (dressing). If soap and water are not available, use hand sanitizer. Remove your dressing in 24 hours. Leave stitches (sutures), skin glue, or adhesive strips in place. These skin closures may need to stay in place for 2 weeks or longer. If adhesive strip edges start to loosen and curl up, you may trim the loose edges. Do not remove adhesive strips completely unless your health care provider tells you to do that. Do not take baths, swim, or use a hot tub for at least 1 week. You may shower 24 hours after the procedure or as told by your health care provider. Remove the dressing and gently wash the incision area with plain soap and water. Pat the area dry with a clean towel. Do not rub the site. That could cause bleeding. Do not apply powder or lotion to the site. Check your incision site every day for signs of infection. Check for: Redness, swelling, or pain. Fluid or blood. Warmth. Pus or a bad smell. Activity For 24 hours after the procedure, or as directed by your health care provider: Do not flex or bend the affected arm. Do not push or pull heavy objects with the affected arm. Do not operate machinery or power tools. Do not drive. You should not drive yourself home from the hospital or clinic if you go home during that time period. You may drive 24  hours after the procedure unless your health care provider tells you not to. Do not lift anything that is heavier than 10 lb (4.5 kg), or the limit that you are told, until your health care provider says that it is safe. Return to your normal activities as told by your health care provider. Ask your health care provider what activities are safe for you and when you can return to work. If you were given a sedative during the procedure, it can affect you for several hours. Do not drive or operate machinery until your health care provider says that it is safe. General instructions Take over-the-counter and prescription medicines only as told by your health care provider. If you will be going home right after the procedure, plan to have a responsible adult care for you for the time you are told. This is important. Keep all follow-up visits. This is important. Contact a health care provider if: You have a fever or chills. You have any of these signs of infection at your incision site: Redness, swelling, or pain. Fluid or blood. Warmth. Pus or a bad smell. Get help right away if: The incision area swells very fast. The incision area is bleeding, and the bleeding does not stop when you hold steady pressure on the area. Your arm or hand becomes pale, cool, tingly, or numb. These symptoms may represent a serious problem that is an emergency. Do not wait to see if the symptoms will go away. Get medical  help right away. Call your local emergency services (911 in the [place] Do not drive yourself to the hospital. Summary After the procedure, it is common to have bruising and tenderness at the incision site. Follow instructions from your health care provider about how to take care of your radial site incision. Check the incision every day for signs of infection. Do not lift anything that is heavier than 10 lb (4.5 kg), or the limit that you are told, until your health care provider says that it is  safe. Get help right away if the incision area swells very fast, you have bleeding at the incision site that will not stop, or your arm or hand becomes pale, cool, or numb. This information is not intended to replace advice given to you by your health care provider. Make sure you discuss any questions you have with your health care provider. Document Revised: 07/19/2020 Document Reviewed: 07/19/2020 Elsevier Patient Education  2024 [employer].

## 2024-07-15 NOTE — Progress Notes (Addendum)
 Discharge instructions reviewed with patient and family at bedside. Denies questions concerns. PT tolerated PO intake. Ambulated in the hallway, was able to void without difficulty. Incision site remains clean dry and intact. No s/s of complications. PT escorted from the unit via wheel chair to personal vehicle.

## 2024-07-15 NOTE — Interval H&P Note (Signed)
 History and Physical Interval Note:  07/15/2024 9:51 AM  [name] [name]  has presented today for surgery, with the diagnosis of cad.  The various methods of treatment have been discussed with the patient and family. After consideration of risks, benefits and other options for treatment, the patient has consented to  Procedures: LEFT HEART CATH AND CORONARY ANGIOGRAPHY (N/A) as a surgical intervention.  The patient's history has been reviewed, patient examined, no change in status, stable for surgery.  I have reviewed the patient's chart and labs.  Questions were answered to the patient's satisfaction.   Cath Lab Visit (complete for each Cath Lab visit)  Clinical Evaluation Leading to the Procedure:   ACS: No.  Non-ACS:    Anginal Classification: No Symptoms  Anti-ischemic medical therapy: Maximal Therapy (2 or more classes of medications)  Non-Invasive Test Results: Intermediate-risk stress test findings: cardiac mortality 1-3%/year  Prior CABG: No previous CABG        [facility] 07/15/2024 9:51 AM

## 2024-07-16 ENCOUNTER — Encounter (HOSPITAL_COMMUNITY): Payer: Self-pay | Admitting: Cardiology

## 2024-07-16 ENCOUNTER — Telehealth: Payer: Self-pay | Admitting: Gastroenterology

## 2024-07-16 NOTE — Telephone Encounter (Addendum)
 Procedure:Endoscopy/Colonoscopy Procedure date: 07/22/24 Procedure location: [facility] Arrival Time: 9:00 am Spoke with the patient Y/N:   No, I left a detailed message on [phone] on 07/16/24 @ 9:39 am for the patient to return call  Yes, 07/17/24 @ 1:25 pm   Any prep concerns? No  Has the patient obtained the prep from the pharmacy ? Yes Do you have a care partner and transportation: Yes Any additional concerns? No

## 2024-07-17 ENCOUNTER — Other Ambulatory Visit: Payer: Self-pay

## 2024-07-17 DIAGNOSIS — K219 Gastro-esophageal reflux disease without esophagitis: Secondary | ICD-10-CM

## 2024-07-17 DIAGNOSIS — R1319 Other dysphagia: Secondary | ICD-10-CM

## 2024-07-22 ENCOUNTER — Encounter (HOSPITAL_COMMUNITY): Payer: Self-pay

## 2024-07-22 ENCOUNTER — Encounter (HOSPITAL_COMMUNITY): Admission: RE | Payer: Self-pay | Source: Home / Self Care

## 2024-07-22 ENCOUNTER — Ambulatory Visit (HOSPITAL_COMMUNITY): Admission: RE | Admit: 2024-07-22 | Source: Home / Self Care | Admitting: Gastroenterology

## 2024-07-22 SURGERY — EGD (ESOPHAGOGASTRODUODENOSCOPY)
Anesthesia: Monitor Anesthesia Care

## 2024-07-24 ENCOUNTER — Ambulatory Visit

## 2024-08-01 ENCOUNTER — Ambulatory Visit

## 2024-08-05 ENCOUNTER — Ambulatory Visit: Admitting: Physician Assistant
